# Patient Record
Sex: Female | Born: 1935 | Race: Black or African American | Hispanic: No | Smoking: Never smoker
Health system: Southern US, Community
[De-identification: ages and names within clinical notes are randomized; demographics above are authoritative.]

## PROBLEM LIST (undated history)

## (undated) DIAGNOSIS — G309 Alzheimer's disease, unspecified: Secondary | ICD-10-CM

## (undated) DIAGNOSIS — F419 Anxiety disorder, unspecified: Secondary | ICD-10-CM

## (undated) DIAGNOSIS — D649 Anemia, unspecified: Secondary | ICD-10-CM

## (undated) DIAGNOSIS — I351 Nonrheumatic aortic (valve) insufficiency: Secondary | ICD-10-CM

## (undated) DIAGNOSIS — R6889 Other general symptoms and signs: Secondary | ICD-10-CM

## (undated) DIAGNOSIS — I272 Pulmonary hypertension, unspecified: Secondary | ICD-10-CM

## (undated) DIAGNOSIS — E559 Vitamin D deficiency, unspecified: Secondary | ICD-10-CM

## (undated) DIAGNOSIS — I5042 Chronic combined systolic (congestive) and diastolic (congestive) heart failure: Secondary | ICD-10-CM

## (undated) DIAGNOSIS — F329 Major depressive disorder, single episode, unspecified: Secondary | ICD-10-CM

## (undated) DIAGNOSIS — I2510 Atherosclerotic heart disease of native coronary artery without angina pectoris: Secondary | ICD-10-CM

## (undated) DIAGNOSIS — K219 Gastro-esophageal reflux disease without esophagitis: Secondary | ICD-10-CM

## (undated) DIAGNOSIS — J302 Other seasonal allergic rhinitis: Secondary | ICD-10-CM

## (undated) DIAGNOSIS — N183 Chronic kidney disease, stage 3 unspecified: Secondary | ICD-10-CM

## (undated) DIAGNOSIS — E785 Hyperlipidemia, unspecified: Secondary | ICD-10-CM

## (undated) DIAGNOSIS — Z66 Do not resuscitate: Secondary | ICD-10-CM

## (undated) DIAGNOSIS — R32 Unspecified urinary incontinence: Secondary | ICD-10-CM

## (undated) DIAGNOSIS — I252 Old myocardial infarction: Secondary | ICD-10-CM

## (undated) DIAGNOSIS — F0280 Dementia in other diseases classified elsewhere without behavioral disturbance: Secondary | ICD-10-CM

## (undated) DIAGNOSIS — M1990 Unspecified osteoarthritis, unspecified site: Secondary | ICD-10-CM

## (undated) DIAGNOSIS — G8929 Other chronic pain: Secondary | ICD-10-CM

## (undated) DIAGNOSIS — R251 Tremor, unspecified: Secondary | ICD-10-CM

## (undated) DIAGNOSIS — I10 Essential (primary) hypertension: Secondary | ICD-10-CM

## (undated) DIAGNOSIS — F32A Depression, unspecified: Secondary | ICD-10-CM

## (undated) DIAGNOSIS — K068 Other specified disorders of gingiva and edentulous alveolar ridge: Secondary | ICD-10-CM

## (undated) DIAGNOSIS — M549 Dorsalgia, unspecified: Secondary | ICD-10-CM

## (undated) DIAGNOSIS — A048 Other specified bacterial intestinal infections: Secondary | ICD-10-CM

## (undated) DIAGNOSIS — J309 Allergic rhinitis, unspecified: Secondary | ICD-10-CM

## (undated) DIAGNOSIS — N644 Mastodynia: Secondary | ICD-10-CM

## (undated) HISTORY — DX: Other specified bacterial intestinal infections: A04.8

## (undated) HISTORY — PX: BREAST LUMPECTOMY: SHX2

## (undated) HISTORY — DX: Other seasonal allergic rhinitis: J30.2

## (undated) HISTORY — DX: Allergic rhinitis, unspecified: J30.9

## (undated) HISTORY — DX: Other chronic pain: G89.29

## (undated) HISTORY — DX: Hyperlipidemia, unspecified: E78.5

## (undated) HISTORY — DX: Dementia in other diseases classified elsewhere, unspecified severity, without behavioral disturbance, psychotic disturbance, mood disturbance, and anxiety: F02.80

## (undated) HISTORY — DX: Atherosclerotic heart disease of native coronary artery without angina pectoris: I25.10

## (undated) HISTORY — DX: Chronic kidney disease, stage 3 (moderate): N18.3

## (undated) HISTORY — DX: Other specified disorders of gingiva and edentulous alveolar ridge: K06.8

## (undated) HISTORY — DX: Unspecified urinary incontinence: R32

## (undated) HISTORY — DX: Chronic combined systolic (congestive) and diastolic (congestive) heart failure: I50.42

## (undated) HISTORY — DX: Gastro-esophageal reflux disease without esophagitis: K21.9

## (undated) HISTORY — DX: Essential (primary) hypertension: I10

## (undated) HISTORY — DX: Nonrheumatic aortic (valve) insufficiency: I35.1

## (undated) HISTORY — DX: Anemia, unspecified: D64.9

## (undated) HISTORY — DX: Dorsalgia, unspecified: M54.9

## (undated) HISTORY — DX: Mastodynia: N64.4

## (undated) HISTORY — DX: Unspecified osteoarthritis, unspecified site: M19.90

## (undated) HISTORY — DX: Anxiety disorder, unspecified: F41.9

## (undated) HISTORY — PX: CHOLECYSTECTOMY: SHX55

## (undated) HISTORY — DX: Other general symptoms and signs: R68.89

## (undated) HISTORY — DX: Depression, unspecified: F32.A

## (undated) HISTORY — DX: Chronic kidney disease, stage 3 unspecified: N18.30

## (undated) HISTORY — DX: Tremor, unspecified: R25.1

## (undated) HISTORY — DX: Major depressive disorder, single episode, unspecified: F32.9

## (undated) HISTORY — DX: Do not resuscitate: Z66

## (undated) HISTORY — DX: Vitamin D deficiency, unspecified: E55.9

## (undated) HISTORY — DX: Pulmonary hypertension, unspecified: I27.20

## (undated) HISTORY — DX: Alzheimer's disease, unspecified: G30.9

---

## 2015-06-23 ENCOUNTER — Encounter (HOSPITAL_COMMUNITY): Payer: Self-pay | Admitting: Emergency Medicine

## 2015-06-23 ENCOUNTER — Emergency Department (HOSPITAL_COMMUNITY): Payer: Medicare FFS

## 2015-06-23 ENCOUNTER — Emergency Department (HOSPITAL_COMMUNITY)
Admission: EM | Admit: 2015-06-23 | Discharge: 2015-06-23 | Disposition: A | Discharge status: Home / Self Care | Payer: Medicare FFS | Attending: Emergency Medicine | Admitting: Emergency Medicine

## 2015-06-23 DIAGNOSIS — S29002A Unspecified injury of muscle and tendon of back wall of thorax, initial encounter: Secondary | ICD-10-CM | POA: Insufficient documentation

## 2015-06-23 DIAGNOSIS — Y998 Other external cause status: Secondary | ICD-10-CM | POA: Diagnosis not present

## 2015-06-23 DIAGNOSIS — S199XXA Unspecified injury of neck, initial encounter: Secondary | ICD-10-CM | POA: Insufficient documentation

## 2015-06-23 DIAGNOSIS — I252 Old myocardial infarction: Secondary | ICD-10-CM | POA: Diagnosis not present

## 2015-06-23 DIAGNOSIS — S29001A Unspecified injury of muscle and tendon of front wall of thorax, initial encounter: Secondary | ICD-10-CM | POA: Diagnosis not present

## 2015-06-23 DIAGNOSIS — Y92410 Unspecified street and highway as the place of occurrence of the external cause: Secondary | ICD-10-CM | POA: Diagnosis not present

## 2015-06-23 DIAGNOSIS — M542 Cervicalgia: Secondary | ICD-10-CM

## 2015-06-23 DIAGNOSIS — Z8719 Personal history of other diseases of the digestive system: Secondary | ICD-10-CM | POA: Insufficient documentation

## 2015-06-23 DIAGNOSIS — F0390 Unspecified dementia without behavioral disturbance: Secondary | ICD-10-CM | POA: Diagnosis not present

## 2015-06-23 DIAGNOSIS — M546 Pain in thoracic spine: Secondary | ICD-10-CM

## 2015-06-23 DIAGNOSIS — R079 Chest pain, unspecified: Secondary | ICD-10-CM

## 2015-06-23 DIAGNOSIS — Y9389 Activity, other specified: Secondary | ICD-10-CM | POA: Diagnosis not present

## 2015-06-23 HISTORY — DX: Old myocardial infarction: I25.2

## 2015-06-23 HISTORY — DX: Gastro-esophageal reflux disease without esophagitis: K21.9

## 2015-06-23 LAB — CBC
HCT: 35.4 % — ABNORMAL LOW (ref 36.0–46.0)
Hemoglobin: 11.7 g/dL — ABNORMAL LOW (ref 12.0–15.0)
MCH: 27.1 pg (ref 26.0–34.0)
MCHC: 33.1 g/dL (ref 30.0–36.0)
MCV: 82.1 fL (ref 78.0–100.0)
PLATELETS: 149 10*3/uL — AB (ref 150–400)
RBC: 4.31 MIL/uL (ref 3.87–5.11)
RDW: 14.6 % (ref 11.5–15.5)
WBC: 4.6 10*3/uL (ref 4.0–10.5)

## 2015-06-23 LAB — BASIC METABOLIC PANEL
Anion gap: 8 (ref 5–15)
BUN: 26 mg/dL — AB (ref 6–20)
CO2: 24 mmol/L (ref 22–32)
CREATININE: 1.4 mg/dL — AB (ref 0.44–1.00)
Calcium: 9.2 mg/dL (ref 8.9–10.3)
Chloride: 107 mmol/L (ref 101–111)
GFR calc Af Amer: 38 mL/min — ABNORMAL LOW (ref >60)
GFR, EST NON AFRICAN AMERICAN: 32 mL/min — AB (ref >60)
Glucose, Bld: 97 mg/dL (ref 65–99)
Potassium: 3.7 mmol/L (ref 3.5–5.1)
SODIUM: 139 mmol/L (ref 135–145)

## 2015-06-23 LAB — TROPONIN I: TROPONIN I: 0.03 ng/mL (ref <0.031)

## 2015-06-23 MED ORDER — ONDANSETRON 4 MG PO TBDP
4.0000 mg | ORAL_TABLET | Freq: Once | ORAL | Status: AC
  Administered 2015-06-23: 4 mg via ORAL
  Filled 2015-06-23: qty 1

## 2015-06-23 MED ORDER — TRAMADOL HCL 50 MG PO TABS: 50.0000 mg | ORAL_TABLET | Freq: Four times a day (QID) | ORAL | Status: DC | PRN

## 2015-06-23 MED ORDER — HYDROCODONE-ACETAMINOPHEN 5-325 MG PO TABS
1.0000 | ORAL_TABLET | Freq: Once | ORAL | Status: AC
  Administered 2015-06-23: 1 via ORAL
  Filled 2015-06-23: qty 1
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
## 2015-06-23 NOTE — ED Notes (Signed)
EKG completed given to EDP.  

## 2015-10-31 LAB — HEPATIC FUNCTION PANEL
ALT: 15 U/L (ref 7–35)
AST: 20 U/L (ref 13–35)

## 2015-10-31 LAB — LIPID PANEL
Cholesterol: 129 mg/dL (ref 0–200)
HDL: 45 mg/dL (ref 35–70)
LDL Cholesterol: 57 mg/dL
Triglycerides: 137 mg/dL (ref 40–160)

## 2015-10-31 LAB — BASIC METABOLIC PANEL
Creatinine: 1.2 mg/dL — AB (ref <=1.1)
Glucose: 73 mg/dL
POTASSIUM: 4.2 mmol/L (ref 3.4–5.3)
SODIUM: 134 mmol/L — AB (ref 137–147)

## 2015-10-31 LAB — ESTIMATED GFR: GFR, EST AFRICAN AMERICAN: 50

## 2016-02-19 ENCOUNTER — Ambulatory Visit (INDEPENDENT_AMBULATORY_CARE_PROVIDER_SITE_OTHER): Payer: Medicare HMO | Admitting: Osteopathic Medicine

## 2016-02-19 ENCOUNTER — Encounter: Payer: Self-pay | Admitting: Osteopathic Medicine

## 2016-02-19 VITALS — BP 124/62 | HR 75 | Ht 65.0 in | Wt 160.0 lb

## 2016-02-19 DIAGNOSIS — K0510 Chronic gingivitis, plaque induced: Secondary | ICD-10-CM

## 2016-02-19 DIAGNOSIS — Z8679 Personal history of other diseases of the circulatory system: Secondary | ICD-10-CM

## 2016-02-19 DIAGNOSIS — I2510 Atherosclerotic heart disease of native coronary artery without angina pectoris: Secondary | ICD-10-CM | POA: Diagnosis not present

## 2016-02-19 DIAGNOSIS — G8929 Other chronic pain: Secondary | ICD-10-CM

## 2016-02-19 DIAGNOSIS — K219 Gastro-esophageal reflux disease without esophagitis: Secondary | ICD-10-CM

## 2016-02-19 DIAGNOSIS — I252 Old myocardial infarction: Secondary | ICD-10-CM | POA: Diagnosis not present

## 2016-02-19 DIAGNOSIS — M549 Dorsalgia, unspecified: Secondary | ICD-10-CM | POA: Insufficient documentation

## 2016-02-19 DIAGNOSIS — F0390 Unspecified dementia without behavioral disturbance: Secondary | ICD-10-CM

## 2016-02-19 DIAGNOSIS — R6889 Other general symptoms and signs: Secondary | ICD-10-CM | POA: Diagnosis not present

## 2016-02-19 DIAGNOSIS — F5101 Primary insomnia: Secondary | ICD-10-CM

## 2016-02-19 MED ORDER — CHLORHEXIDINE GLUCONATE 0.12% ORAL RINSE (MEDLINE KIT): 15.0000 mL | Freq: Two times a day (BID) | OROMUCOSAL | 1 refills | Status: DC

## 2016-02-19 MED ORDER — TRAZODONE HCL 100 MG PO TABS: 100.0000 mg | ORAL_TABLET | Freq: Every day | ORAL | 3 refills | Status: DC

## 2016-02-19 MED ORDER — SPIRONOLACTONE 25 MG PO TABS: 25.0000 mg | ORAL_TABLET | Freq: Every day | ORAL | 1 refills | Status: DC

## 2016-02-19 MED ORDER — LISINOPRIL 2.5 MG PO TABS: 2.5000 mg | ORAL_TABLET | Freq: Every day | ORAL | 1 refills | Status: DC

## 2016-02-19 MED ORDER — ATORVASTATIN CALCIUM 20 MG PO TABS: 20.0000 mg | ORAL_TABLET | Freq: Every day | ORAL | 3 refills | Status: DC

## 2016-02-19 MED ORDER — ISOSORBIDE MONONITRATE ER 30 MG PO TB24: 30.0000 mg | ORAL_TABLET | Freq: Every day | ORAL | 1 refills | Status: DC

## 2016-02-19 MED ORDER — DONEPEZIL HCL 10 MG PO TABS: 10.0000 mg | ORAL_TABLET | Freq: Every day | ORAL | 1 refills | Status: DC

## 2016-02-19 MED ORDER — DICLOFENAC SODIUM 1 % TD GEL: 4.0000 g | Freq: Four times a day (QID) | TRANSDERMAL | 3 refills | Status: DC

## 2016-02-19 MED ORDER — FUROSEMIDE 40 MG PO TABS: 40.0000 mg | ORAL_TABLET | Freq: Every day | ORAL | 1 refills | Status: DC

## 2016-02-19 NOTE — Progress Notes (Signed)
HPI: Madeline Atkinson is a 83 y.o. female  who presents to [facility] today, 02/19/16,  for chief complaint of:  Chief Complaint  Patient presents with  . Establish Care    Very pleasant new patient here to establish care, accompanied by daughter. Daughter has detailed records with her, gives much of the history. Patient and daughter have no complaints today except for sore gums, they are in the process of finding a dentist. Transferring care d/t recent move to the area.   CARDIOVASCULAR MI 2017, no PCI d/t age Chronic Systolic and Diastolic CHF per daughter  On Imdur, Spironolactone, Carvedilol, Furosemide, Atorvastatin 20 mg, Lisinopril.  Was released from heart clinic, needs new cardiologist   RESPIRATORY Never smoker, secondhand smoke exposure   NEUROLOGICAL Previously on "nerve pill" for depression after husband's death Dementia - is on Donepezil  Insomnia - controlled on Trazodone   URINARY Incontinence - uses Depends. No UTI.   GASTROINTESTINAL ocasoinally needs stool softener GERD - on Omeprazole, other medications not able to control symptoms  MUSCULOSKELETAL/RHEUM Back pain - on Naproxen 220 bid  Had also been on other medication which caused nausea (think was muscle relaxer), nervous to try Tylenol d/t liver labs abn? No records available at this time  HEENT Seasonal allergies, controlled with OTC medication  Gum pain recently, no difficulty chewing    Past medical, surgical, social and family history reviewed: Patient Active Problem List   Diagnosis Date Noted  . History of MI (myocardial infarction) 02/19/2016  . Coronary artery disease involving native coronary artery of native heart without angina pectoris 02/19/2016  . History of CHF (congestive heart failure) 02/19/2016  . Dementia 02/19/2016  . GERD (gastroesophageal reflux disease) 02/19/2016  . Back pain 02/19/2016  . Cold intolerance 02/19/2016   Past Surgical  History:  Procedure Laterality Date  . BREAST LUMPECTOMY    . CHOLECYSTECTOMY     Social History  Substance Use Topics  . Smoking status: Never Smoker  . Smokeless tobacco: Never Used  . Alcohol use No   History reviewed. No pertinent family history.   Current medication list and allergy/intolerance information reviewed:   Current Outpatient Prescriptions  Medication Sig Dispense Refill  . carvedilol (COREG) 3.125 MG tablet     . omeprazole (PRILOSEC) 40 MG capsule      No current facility-administered medications for this visit.    Allergies  Allergen Reactions  . Codeine       Review of Systems:  Constitutional:  No  fever, no chills, No recent illness, No unintentional weight changes.  HEENT: No  headache, no vision change, no hearing change, No sore throat, No  sinus pressure  Cardiac: No  chest pain, No  pressure, No palpitations, No  Orthopnea  Respiratory:  No  shortness of breath. No  Cough  Gastrointestinal: No  abdominal pain, No  nausea, No  vomiting,  No  blood in stool, No  diarrhea, No  constipation   Musculoskeletal: No new myalgia/arthralgia  Genitourinary: No  incontinence, No  abnormal genital bleeding, No abnormal genital discharge  Skin: No  Rash, No other wounds/concerning lesions  Hem/Onc: No  easy bruising/bleeding, No  abnormal lymph node  Endocrine: No cold intolerance,  No heat intolerance. No polyuria/polydipsia/polyphagia   Neurologic: No  weakness, No  dizziness, No  slurred speech/focal weakness/facial droop, +memory problems  Psychiatric: No  concerns with depression, No  concerns with anxiety, +sleep problems, No mood problems  Exam:  BP  124/62   Pulse 75   Ht 5' 5" (1.651 m)   Wt 160 lb (72.6 kg)   BMI 26.63 kg/m   Constitutional: VS see above. General Appearance: alert, well-developed, well-nourished, NAD  Eyes: Normal lids and conjunctive, non-icteric sclera  Ears, Nose, Mouth, Throat: MMM, Normal external inspection  ears/nares/mouth/lips/gums. TM normal bilaterally. Pharynx/tonsils no erythema, no exudate. Nasal mucosa normal.   Neck: No masses, trachea midline. No thyroid enlargement. No tenderness/mass appreciated. No lymphadenopathy  Respiratory: Normal respiratory effort. no wheeze, no rhonchi, no rales  Cardiovascular: S1/S2 normal, no murmur, no rub/gallop auscultated. RRR. No lower extremity edema.   Gastrointestinal: Nontender, no masses. No hepatomegaly, no splenomegaly. No hernia appreciated. Bowel sounds normal. Rectal exam deferred.   Musculoskeletal: Gait normal. No clubbing/cyanosis of digits.   Neurological: Normal balance/coordination. No tremor.   Skin: warm, dry, intact.   Psychiatric: Normal judgment/insight. Normal mood and affect. Oriented x3.     ASSESSMENT/PLAN:    Coronary artery disease involving native coronary artery of native heart without angina pectoris - Plan: carvedilol (COREG) 3.125 MG tablet, aspirin 81 MG tablet, Ambulatory referral to Cardiology, CBC with Differential/Platelet, COMPLETE METABOLIC PANEL WITH GFR, VITAMIN D 25 Hydroxy (Vit-D Deficiency, Fractures), TSH, Lipid panel, atorvastatin (LIPITOR) 20 MG tablet, isosorbide mononitrate (IMDUR) 30 MG 24 hr tablet, lisinopril (PRINIVIL,ZESTRIL) 2.5 MG tablet  History of MI (myocardial infarction)  Cold intolerance - Plan: TSH  History of CHF (congestive heart failure) - Plan: carvedilol (COREG) 3.125 MG tablet, aspirin 81 MG tablet, atorvastatin (LIPITOR) 20 MG tablet, spironolactone (ALDACTONE) 25 MG tablet, isosorbide mononitrate (IMDUR) 30 MG 24 hr tablet, furosemide (LASIX) 40 MG tablet, lisinopril (PRINIVIL,ZESTRIL) 2.5 MG tablet  Dementia without behavioral disturbance, unspecified dementia type - Plan: donepezil (ARICEPT) 10 MG tablet  Gastroesophageal reflux disease, esophagitis presence not specified - Plan: omeprazole (PRILOSEC) 40 MG capsule  Chronic bilateral back pain, unspecified back  location - Plan: CVS NAPROXEN SODIUM PO, diclofenac sodium (VOLTAREN) 1 % GEL  Primary insomnia - Plan: traZODone (DESYREL) 100 MG tablet  Gingivitis - Plan: chlorhexidine gluconate, MEDLINE KIT, (PERIDEX) 0.12 % solution    Visit summary with medication list and pertinent instructions was printed for patient to review. All questions at time of visit were answered - patient instructed to contact office with any additional concerns. ER/RTC precautions were reviewed with the patient. Follow-up plan: Return in about 4 months (around 06/18/2016) for followup on routine chronic issues . Pending lab results  Note: Total time spent 45 minutes, greater than 50% of the visit was spent face-to-face counseling and coordinating care for the following: The primary encounter diagnosis was Coronary artery disease involving native coronary artery of native heart without angina pectoris. Diagnoses of History of MI (myocardial infarction), Cold intolerance, History of CHF (congestive heart failure), Dementia without behavioral disturbance, unspecified dementia type, Gastroesophageal reflux disease, esophagitis presence not specified, Chronic bilateral back pain, unspecified back location, Primary insomnia, and Gingivitis were also pertinent to this visit.Marland Kitchen

## 2016-03-05 ENCOUNTER — Encounter: Payer: Self-pay | Admitting: *Deleted

## 2016-03-10 ENCOUNTER — Other Ambulatory Visit: Payer: Self-pay

## 2016-03-10 DIAGNOSIS — M549 Dorsalgia, unspecified: Principal | ICD-10-CM

## 2016-03-10 DIAGNOSIS — G8929 Other chronic pain: Secondary | ICD-10-CM

## 2016-03-10 MED ORDER — DICLOFENAC SODIUM 1 % TD GEL: 4.0000 g | Freq: Four times a day (QID) | TRANSDERMAL | 3 refills | Status: DC

## 2016-03-10 NOTE — Telephone Encounter (Signed)
REFILL REQUEST FOR DICLOFENAC 1%

## 2016-03-16 ENCOUNTER — Ambulatory Visit (INDEPENDENT_AMBULATORY_CARE_PROVIDER_SITE_OTHER): Payer: Medicare HMO | Admitting: Cardiology

## 2016-03-16 ENCOUNTER — Encounter: Payer: Self-pay | Admitting: Cardiology

## 2016-03-16 ENCOUNTER — Encounter (INDEPENDENT_AMBULATORY_CARE_PROVIDER_SITE_OTHER): Payer: Self-pay

## 2016-03-16 VITALS — BP 130/60 | HR 76 | Ht 65.0 in | Wt 162.1 lb

## 2016-03-16 DIAGNOSIS — I5042 Chronic combined systolic (congestive) and diastolic (congestive) heart failure: Secondary | ICD-10-CM | POA: Diagnosis not present

## 2016-03-16 DIAGNOSIS — E785 Hyperlipidemia, unspecified: Secondary | ICD-10-CM

## 2016-03-16 DIAGNOSIS — I2510 Atherosclerotic heart disease of native coronary artery without angina pectoris: Secondary | ICD-10-CM

## 2016-03-16 NOTE — Patient Instructions (Signed)

## 2016-03-16 NOTE — Progress Notes (Signed)
Cardiology Office Note    Date:  03/17/2016   ID:  Madeline Atkinson, DOB 1924/10/19, MRN [id number]  PCP:  Emeterio Reeve, DO  Cardiologist:  Fransico Him, MD   Chief Complaint  Patient presents with  . Coronary Artery Disease  . Hypertension  . Congestive Heart Failure  . Cardiomyopathy    History of Present Illness:  Madeline Atkinson is a 83 y.o. female with a history of GERD, Dementia, chronic combined systolic/diastolic CHF and MI years ago and then in October of 2016 with no cath due to advanced age who is referred to establish new cardiac care.  She recently moved to the area and is with her daughter. She is on BB, ASA, statin, Imdur, ACE I and Lasix.  She is doing well from a cardiac standpoint. Her anginal equivalent is upper abdominal pain which she has not had since the MI.  She has had a few hospitalizations for acute CHF exacerbations, the last being a year ago.   She denies any chest of abdominal pain, SOB, DOE, LE edema, dizziness, palpitations, PND, orthopnea or syncope.     Past Medical History:  Diagnosis Date  . Acid reflux   . Back pain   . CAD (coronary artery disease), native coronary artery    without angina pectoris  . Chronic combined systolic and diastolic heart failure ([place]) 03/17/2016  . Cold intolerance   . Dementia   . GERD (gastroesophageal reflux disease)   . H/O CHF   . Hyperlipidemia LDL goal <70 03/17/2016  . MI, old   . Pain in gums   . Seasonal allergies   . Vitamin D deficiency     Past Surgical History:  Procedure Laterality Date  . BREAST LUMPECTOMY    . CHOLECYSTECTOMY      Current Medications: Current Meds  Medication Sig  . aspirin 81 MG tablet Take 81 mg by mouth daily.  Marland Kitchen atorvastatin (LIPITOR) 20 MG tablet Take 1 tablet (20 mg total) by mouth daily.  . carvedilol (COREG) 3.125 MG tablet   . chlorhexidine gluconate, MEDLINE KIT, (PERIDEX) 0.12 % solution Use as directed 15 mLs in the mouth or throat 2 (two) times daily.  . CVS  NAPROXEN SODIUM PO Take 220 mg by mouth every 8 (eight) hours as needed.  . diclofenac sodium (VOLTAREN) 1 % GEL Apply 4 g topically 4 (four) times daily. Apply to affected painful area  . donepezil (ARICEPT) 10 MG tablet Take 1 tablet (10 mg total) by mouth at bedtime.  . furosemide (LASIX) 40 MG tablet Take 1 tablet (40 mg total) by mouth daily.  . isosorbide mononitrate (IMDUR) 30 MG 24 hr tablet Take 1 tablet (30 mg total) by mouth daily.  Marland Kitchen lisinopril (PRINIVIL,ZESTRIL) 2.5 MG tablet Take 1 tablet (2.5 mg total) by mouth daily.  Marland Kitchen omeprazole (PRILOSEC) 40 MG capsule   . spironolactone (ALDACTONE) 25 MG tablet Take 1 tablet (25 mg total) by mouth daily.  . traZODone (DESYREL) 100 MG tablet Take 1 tablet (100 mg total) by mouth at bedtime.    Allergies:   Codeine   Social History   Social History  . Marital status: Widowed    Spouse name: N/A  . Number of children: N/A  . Years of education: N/A   Social History Main Topics  . Smoking status: Never Smoker  . Smokeless tobacco: Never Used  . Alcohol use No  . Drug use: No  . Sexual activity: No   Other Topics Concern  .  None   Social History Narrative  . None     Family History:  The patient's family history includes Heart attack in her father; Heart disease in her father and mother; Heart failure in her mother.   ROS:   Please see the history of present illness.    ROS All other systems reviewed and are negative.  No flowsheet data found.     PHYSICAL EXAM:   VS:  BP 130/60   Pulse 76   Ht 5' 5"  (1.651 m)   Wt 162 lb 1.9 oz (73.5 kg)   SpO2 99%   BMI 26.98 kg/m    GEN: Well nourished, well developed, in no acute distress  HEENT: normal  Neck: no JVD, carotid bruits, or masses Cardiac: RRR; no murmurs, rubs, or gallops,no edema.  Intact distal pulses bilaterally.  Respiratory:  clear to auscultation bilaterally, normal work of breathing GI: soft, nontender, nondistended, + BS MS: no deformity or atrophy    Skin: warm and dry, no rash Neuro:  Alert and Oriented x 3, Strength and sensation are intact Psych: euthymic mood, full affect  Wt Readings from Last 3 Encounters:  03/16/16 162 lb 1.9 oz (73.5 kg)  02/19/16 160 lb (72.6 kg)  06/23/15 160 lb 5 oz (72.7 kg)      Studies/Labs Reviewed:   EKG:  EKG is ordered today.    Recent Labs: 06/23/2015: BUN 26; Creatinine, Ser 1.40; Hemoglobin 11.7; Platelets 149; Potassium 3.7; Sodium 139   Lipid Panel No results found for: CHOL, TRIG, HDL, CHOLHDL, VLDL, LDLCALC, LDLDIRECT  Additional studies/ records that were reviewed today include:  OV notes from PCP    ASSESSMENT:    1. Coronary artery disease involving native coronary artery of native heart without angina pectoris   2. Chronic combined systolic and diastolic heart failure ([place])   3. Hyperlipidemia LDL goal <70      PLAN:  In order of problems listed above:  1,.  ASCAD s/p MI on medical management.  No cath was performed due to advanced age.  Her anginal equivalent is upper abdominal pain which she has not had.  She is doing well without any complaints. She will continue on ASA/statin/long acting nitrate and BB.    2.   Chronic combined systolic/diastolic CHF - She appears euvolemic on exam.  She weighs herself daily and weight has been stable.  She knows to call if her weight increases more than 3 lbs in a day or 5lbs in a week.  She will continue on ACE I, BB, aldactone and Lasix.  I will get a copy of her last echo from her prior Cardiologist.    3.   Hyperlipidemia with LDL goal < 70.  She will continue on statin.      Medication Adjustments/Labs and Tests Ordered: Current medicines are reviewed at length with the patient today.  Concerns regarding medicines are outlined above.  Medication changes, Labs and Tests ordered today are listed in the Patient Instructions below.  Patient Instructions  Medication Instructions:  Your physician recommends that you continue on  your current medications as directed. Please refer to the Current Medication list given to you today.   Labwork: None  Testing/Procedures: None  Follow-Up: Your physician wants you to follow-up in: 6 months with Dr. Radford Pax. You will receive a reminder letter in the mail two months in advance. If you don't receive a letter, please call our office to schedule the follow-up appointment.   Any Other Special  Instructions Will Be Listed Below (If Applicable).     If you need a refill on your cardiac medications before your next appointment, please call your pharmacy.      Signed, Fransico Him, MD  03/17/2016 12:12 PM    [address], [place], [place]  [address] Phone: [phone]; Fax: [phone]

## 2016-03-17 ENCOUNTER — Encounter: Payer: Self-pay | Admitting: Cardiology

## 2016-03-17 DIAGNOSIS — E785 Hyperlipidemia, unspecified: Secondary | ICD-10-CM

## 2016-03-17 DIAGNOSIS — I5042 Chronic combined systolic (congestive) and diastolic (congestive) heart failure: Secondary | ICD-10-CM | POA: Insufficient documentation

## 2016-03-17 HISTORY — DX: Hyperlipidemia, unspecified: E78.5

## 2016-03-24 ENCOUNTER — Encounter: Payer: Self-pay | Admitting: Cardiology

## 2016-03-24 DIAGNOSIS — G8929 Other chronic pain: Secondary | ICD-10-CM | POA: Insufficient documentation

## 2016-03-24 DIAGNOSIS — J309 Allergic rhinitis, unspecified: Secondary | ICD-10-CM | POA: Insufficient documentation

## 2016-03-24 DIAGNOSIS — M549 Dorsalgia, unspecified: Secondary | ICD-10-CM

## 2016-03-24 DIAGNOSIS — G309 Alzheimer's disease, unspecified: Secondary | ICD-10-CM

## 2016-03-24 DIAGNOSIS — N644 Mastodynia: Secondary | ICD-10-CM | POA: Insufficient documentation

## 2016-03-24 DIAGNOSIS — R32 Unspecified urinary incontinence: Secondary | ICD-10-CM | POA: Insufficient documentation

## 2016-03-24 DIAGNOSIS — F32A Depression, unspecified: Secondary | ICD-10-CM | POA: Insufficient documentation

## 2016-03-24 DIAGNOSIS — F0280 Dementia in other diseases classified elsewhere without behavioral disturbance: Secondary | ICD-10-CM | POA: Insufficient documentation

## 2016-03-24 DIAGNOSIS — A048 Other specified bacterial intestinal infections: Secondary | ICD-10-CM | POA: Insufficient documentation

## 2016-03-24 DIAGNOSIS — I351 Nonrheumatic aortic (valve) insufficiency: Secondary | ICD-10-CM | POA: Insufficient documentation

## 2016-03-24 DIAGNOSIS — Z66 Do not resuscitate: Secondary | ICD-10-CM | POA: Insufficient documentation

## 2016-03-24 DIAGNOSIS — M1990 Unspecified osteoarthritis, unspecified site: Secondary | ICD-10-CM | POA: Insufficient documentation

## 2016-03-24 DIAGNOSIS — F329 Major depressive disorder, single episode, unspecified: Secondary | ICD-10-CM | POA: Insufficient documentation

## 2016-03-24 DIAGNOSIS — R251 Tremor, unspecified: Secondary | ICD-10-CM | POA: Insufficient documentation

## 2016-03-24 DIAGNOSIS — F419 Anxiety disorder, unspecified: Secondary | ICD-10-CM | POA: Insufficient documentation

## 2016-03-24 DIAGNOSIS — I10 Essential (primary) hypertension: Secondary | ICD-10-CM | POA: Insufficient documentation

## 2016-03-24 DIAGNOSIS — I272 Pulmonary hypertension, unspecified: Secondary | ICD-10-CM | POA: Insufficient documentation

## 2016-03-26 ENCOUNTER — Encounter: Payer: Self-pay | Admitting: Osteopathic Medicine

## 2016-04-19 ENCOUNTER — Ambulatory Visit (INDEPENDENT_AMBULATORY_CARE_PROVIDER_SITE_OTHER): Payer: Medicare HMO | Admitting: Osteopathic Medicine

## 2016-04-19 VITALS — BP 121/57 | HR 70 | Temp 98.5°F | Wt 160.0 lb

## 2016-04-19 DIAGNOSIS — J069 Acute upper respiratory infection, unspecified: Secondary | ICD-10-CM | POA: Diagnosis not present

## 2016-04-19 DIAGNOSIS — B9789 Other viral agents as the cause of diseases classified elsewhere: Secondary | ICD-10-CM

## 2016-04-19 MED ORDER — IPRATROPIUM BROMIDE 0.03 % NA SOLN: 1.0000 | Freq: Four times a day (QID) | NASAL | 0 refills | Status: DC

## 2016-04-19 NOTE — Patient Instructions (Signed)
Note: the following list assumes no pregnancy, normal liver & kidney function and no other drug interactions. Dr. Sheppard Coil has highlighted medications which are safe for you to use, but these may not be appropriate for everyone. Always ask a pharmacist or qualified medical provider if there are any questions!    Aches/Pains, Fever Acetaminophen (Tylenol) 500 mg tablets - take max 2 tablets (1000 mg) every 6 hours (4 times per day)   Sinus Congestion Prescription Atrovent Nasal Saline if desired Phenylephrine (Sudafed) 10 mg tablets every 4 hours (or the 12-hour formulation) Diphenhydramine (Benadryl) 25 mg tablets - take 1-2 tablets every 6 hours  Cough & Sore Throat Prescription cough pills or syrups Dextromethorphan (Robitussin, others) - cough suppressant Guaifenesin (Robitussin, Mucinex, others) - expectorant (helps cough up mucus) (Dextromethorphan and Guaifenesin also come in a combination tablet) Lozenges w/ Benzocaine + Menthol (Cepacol) Honey - as much as you want! Teas which "coat the throat" - look for ingredients Elm Bark, Licorice Root, Marshmallow Root  Other Zinc Lozenges within 24 hours of symptoms onset - mixed evidence this shortens the duration of the common cold Don't waste your money on Vitamin C or Echinacea

## 2016-04-19 NOTE — Progress Notes (Signed)
HPI: Madeline Atkinson is a 83 y.o. female who presents to [facility] 04/19/16 for chief complaint of:  Chief Complaint  Patient presents with  . Cough    Acute Illness: Cough/cold symptoms for the past 2-3 days. Daughter was concerned given patient's heart history. No fever or shortness of breath. Occasional mucus production. Sinus congestion.   Past medical, social and family history reviewed.  Patient Active Problem List   Diagnosis Date Noted  . Allergic rhinitis   . Anxiety   . Chronic back pain   . Alzheimer disease   . Depression   . Tremor   . Benign essential HTN   . Mastodynia   . OA (osteoarthritis)   . Urinary incontinence   . H. pylori infection   . Pulmonary hypertension   . Aortic insufficiency   . DNR (do not resuscitate)   . Chronic combined systolic and diastolic heart failure ([place]) 03/17/2016  . Hyperlipidemia LDL goal <70 03/17/2016  . History of MI (myocardial infarction) 02/19/2016  . Coronary artery disease involving native coronary artery of native heart without angina pectoris 02/19/2016  . Dementia 02/19/2016  . GERD (gastroesophageal reflux disease) 02/19/2016  . Back pain 02/19/2016  . Cold intolerance 02/19/2016    Current medications and allergies reviewed.     Review of Systems:  Constitutional: No  fever/chills  HEENT: sinus headache, No  sore throat, No  swollen glands  Cardiovascular: No chest pain  Respiratory:Yes  cough, No  shortness of breath  Gastrointestinal: No  nausea, No  vomiting,  No  diarrhea  Musculoskeletal:   No  myalgia/arthralgia  Skin/Integument:  No  rash   Detailed Exam:  BP (!) 121/57   Pulse 70   Temp 98.5 F (36.9 C) (Oral)   Wt 160 lb (72.6 kg)   BMI 26.63 kg/m   Constitutional:   VSS, see above.   General Appearance: alert, well-developed, well-nourished, NAD  Eyes:   Normal lids and conjunctive, non-icteric sclera  Ears, Nose, Mouth, Throat:   Normal  external inspection ears/nares  Normal mouth/lips/gums, MMM  normal TM  posterior pharynx without erythema, without exudate  nasal mucosa normal  Skin:  Normal inspection, no rash or concerning lesions noted on limited exam  Neck:   No masses, trachea midline. normal lymph nodes  Respiratory:   Normal respiratory effort.   No  wheeze/rhonchi/rales  Cardiovascular:   S1/S2 normal, no murmur/rub/gallop auscultated. RRR.     ASSESSMENT/PLAN:  Viral upper respiratory illness - Plan: ipratropium (ATROVENT) 0.03 % nasal spray  Patient's daughter requested note for adult daycare. Advised that patient certainly could be contagious, standard contact precautions were reviewed but I don't think any extensive isolation measures need to be taken. Conservative supportive care for now, precautions were reviewed with regard to worsening or changing symptoms. No fever or shortness of breath and normal lung exam reassuring. Most likely viral illness   Patient Instructions  Note: the following list assumes no pregnancy, normal liver & kidney function and no other drug interactions. Dr. Sheppard Coil has highlighted medications which are safe for you to use, but these may not be appropriate for everyone. Always ask a pharmacist or qualified medical provider if there are any questions!    Aches/Pains, Fever Acetaminophen (Tylenol) 500 mg tablets - take max 2 tablets (1000 mg) every 6 hours (4 times per day)   Sinus Congestion Prescription Atrovent Nasal Saline if desired Phenylephrine (Sudafed) 10 mg tablets every 4 hours (or the  12-hour formulation) Diphenhydramine (Benadryl) 25 mg tablets - take 1-2 tablets every 6 hours  Cough & Sore Throat Prescription cough pills or syrups Dextromethorphan (Robitussin, others) - cough suppressant Guaifenesin (Robitussin, Mucinex, others) - expectorant (helps cough up mucus) (Dextromethorphan and Guaifenesin also come in a combination  tablet) Lozenges w/ Benzocaine + Menthol (Cepacol) Honey - as much as you want! Teas which "coat the throat" - look for ingredients Elm Bark, Licorice Root, Marshmallow Root  Other Zinc Lozenges within 24 hours of symptoms onset - mixed evidence this shortens the duration of the common cold Don't waste your money on Vitamin C or Echinacea     Visit summary was printed for the patient with medications and pertinent instructions for patient to review. ER/RTC precautions reviewed. All questions answered. Return if symptoms worsen or fail to improve, or if fever / shortness of breath develop .

## 2016-04-30 ENCOUNTER — Telehealth: Payer: Self-pay | Admitting: *Deleted

## 2016-04-30 DIAGNOSIS — Z8679 Personal history of other diseases of the circulatory system: Secondary | ICD-10-CM

## 2016-04-30 DIAGNOSIS — I2510 Atherosclerotic heart disease of native coronary artery without angina pectoris: Secondary | ICD-10-CM

## 2016-04-30 DIAGNOSIS — K219 Gastro-esophageal reflux disease without esophagitis: Secondary | ICD-10-CM

## 2016-04-30 MED ORDER — CARVEDILOL 3.125 MG PO TABS: 3.1250 mg | ORAL_TABLET | Freq: Two times a day (BID) | ORAL | 3 refills | Status: DC

## 2016-04-30 MED ORDER — OMEPRAZOLE 40 MG PO CPDR: 40.0000 mg | DELAYED_RELEASE_CAPSULE | Freq: Every day | ORAL | 3 refills | Status: DC

## 2016-04-30 NOTE — Telephone Encounter (Signed)
Sent!

## 2016-04-30 NOTE — Telephone Encounter (Signed)
Received refill request for carvedilol and omeprazole. Since these are historical medications a provider has to send them initially

## 2016-05-10 ENCOUNTER — Other Ambulatory Visit: Payer: Self-pay

## 2016-05-10 NOTE — Telephone Encounter (Signed)
Refill request for Rosuvastatin. 20 mg. Patient is not on this medication. Rhonda Cunningham,CMA

## 2016-05-13 ENCOUNTER — Other Ambulatory Visit: Payer: Self-pay

## 2016-05-13 DIAGNOSIS — Z8679 Personal history of other diseases of the circulatory system: Secondary | ICD-10-CM

## 2016-05-13 DIAGNOSIS — I2510 Atherosclerotic heart disease of native coronary artery without angina pectoris: Secondary | ICD-10-CM

## 2016-05-13 MED ORDER — ATORVASTATIN CALCIUM 20 MG PO TABS: 20.0000 mg | ORAL_TABLET | Freq: Every day | ORAL | 3 refills | Status: DC

## 2016-05-13 NOTE — Telephone Encounter (Signed)
REFILL REQUEST FOR ATORVASTATIN 20 MG. #[id number] REFILLS SENT TO [facility]. Rhonda Cunningham,CMA

## 2016-05-17 ENCOUNTER — Other Ambulatory Visit: Payer: Self-pay

## 2016-05-17 DIAGNOSIS — J069 Acute upper respiratory infection, unspecified: Secondary | ICD-10-CM

## 2016-05-17 MED ORDER — IPRATROPIUM BROMIDE 0.03 % NA SOLN: 1.0000 | Freq: Four times a day (QID) | NASAL | 0 refills | Status: DC

## 2016-06-18 ENCOUNTER — Ambulatory Visit (INDEPENDENT_AMBULATORY_CARE_PROVIDER_SITE_OTHER): Payer: Medicare HMO | Admitting: Osteopathic Medicine

## 2016-06-18 VITALS — BP 117/73 | HR 90 | Wt 159.0 lb

## 2016-06-18 DIAGNOSIS — F0390 Unspecified dementia without behavioral disturbance: Secondary | ICD-10-CM

## 2016-06-18 DIAGNOSIS — B354 Tinea corporis: Secondary | ICD-10-CM

## 2016-06-18 DIAGNOSIS — M1990 Unspecified osteoarthritis, unspecified site: Secondary | ICD-10-CM

## 2016-06-18 DIAGNOSIS — I2510 Atherosclerotic heart disease of native coronary artery without angina pectoris: Secondary | ICD-10-CM | POA: Diagnosis not present

## 2016-06-18 MED ORDER — CLOTRIMAZOLE 1 % EX CREA: 1.0000 "application " | TOPICAL_CREAM | Freq: Two times a day (BID) | CUTANEOUS | 1 refills | Status: DC

## 2016-06-18 NOTE — Progress Notes (Signed)
HPI: Madeline Atkinson is a 83 y.o. female  who presents to [facility] today, 06/18/16,  for chief complaint of:  Chief Complaint  Patient presents with  . Follow-up    Needs paperwork filled out for benefits from [place] and clearance to participate in adult daycare activities.  Skin: Reddish, scaly patches and a few spots on the legs, not itchy, no over-the-counter medications or home remedies tried, no drainage. No joint pain/fever. No history of psoriasis or other skin disorder.  Past medical history, surgical history, social history and family history reviewed.  Patient Active Problem List   Diagnosis Date Noted  . Allergic rhinitis   . Anxiety   . Chronic back pain   . Alzheimer disease   . Depression   . Tremor   . Benign essential HTN   . Mastodynia   . OA (osteoarthritis)   . Urinary incontinence   . H. pylori infection   . Pulmonary hypertension ([place])   . Aortic insufficiency   . DNR (do not resuscitate)   . Chronic combined systolic and diastolic heart failure ([place]) 03/17/2016  . Hyperlipidemia LDL goal <70 03/17/2016  . History of MI (myocardial infarction) 02/19/2016  . Coronary artery disease involving native coronary artery of native heart without angina pectoris 02/19/2016  . Dementia 02/19/2016  . GERD (gastroesophageal reflux disease) 02/19/2016  . Back pain 02/19/2016  . Cold intolerance 02/19/2016    Current medication list and allergy/intolerance information reviewed.   Current Outpatient Prescriptions on File Prior to Visit  Medication Sig Dispense Refill  . aspirin 81 MG tablet Take 81 mg by mouth daily.    Marland Kitchen atorvastatin (LIPITOR) 20 MG tablet Take 1 tablet (20 mg total) by mouth daily. 90 tablet 3  . carvedilol (COREG) 3.125 MG tablet Take 1 tablet (3.125 mg total) by mouth 2 (two) times daily with a meal. 180 tablet 3  . chlorhexidine gluconate, MEDLINE KIT, (PERIDEX) 0.12 % solution Use as directed 15 mLs in the mouth or  throat 2 (two) times daily. 473 mL 1  . Cholecalciferol (VITAMIN D PO) Take by mouth. Vitamin D Hydroxy 25 daily    . [facility] NAPROXEN SODIUM PO Take 220 mg by mouth every 8 (eight) hours as needed.    . diclofenac sodium (VOLTAREN) 1 % GEL Apply 4 g topically 4 (four) times daily. Apply to affected painful area 100 g 3  . donepezil (ARICEPT) 10 MG tablet Take 1 tablet (10 mg total) by mouth at bedtime. 90 tablet 1  . furosemide (LASIX) 40 MG tablet Take 1 tablet (40 mg total) by mouth daily. 90 tablet 1  . ipratropium (ATROVENT) 0.03 % nasal spray Place 1-2 sprays into both nostrils 4 (four) times daily. 30 mL 0  . isosorbide mononitrate (IMDUR) 30 MG 24 hr tablet Take 1 tablet (30 mg total) by mouth daily. 90 tablet 1  . lisinopril (PRINIVIL,ZESTRIL) 2.5 MG tablet Take 1 tablet (2.5 mg total) by mouth daily. 90 tablet 1  . omeprazole (PRILOSEC) 40 MG capsule Take 1 capsule (40 mg total) by mouth daily. 90 capsule 3  . spironolactone (ALDACTONE) 25 MG tablet Take 1 tablet (25 mg total) by mouth daily. 90 tablet 1  . traZODone (DESYREL) 100 MG tablet Take 1 tablet (100 mg total) by mouth at bedtime. 90 tablet 3   No current facility-administered medications on file prior to visit.    Allergies  Allergen Reactions  . Codeine  Review of Systems:  Constitutional: No recent illness  HEENT: No  headache, no vision change  Cardiac: No  chest pain,  Respiratory:  No  shortness of breath.   Musculoskeletal: No new myalgia/arthralgia  Skin: +Rash  Psychiatric: No  concerns with depression, No  concerns with anxiety  Exam:  BP 117/73   Pulse 90   Wt 159 lb (72.1 kg)   BMI 26.46 kg/m   Constitutional: VS see above. General Appearance: alert, well-developed, well-nourished, NAD  Eyes: Normal lids and conjunctive, non-icteric sclera  Ears, Nose, Mouth, Throat: MMM, Normal external inspection ears/nares/mouth/lips/gums.  Neck: No masses, trachea midline.   Respiratory: Normal  respiratory effort. no wheeze, no rhonchi, no rales  Cardiovascular: S1/S2 normal, no murmur, no rub/gallop auscultated. RRR.   Musculoskeletal: Gait normal. Symmetric and independent movement of all extremities  Neurological: Normal balance/coordination. No tremor.  Skin: warm, dry, intact. Done to quarter-sized roundish scaly patches on leg 4  Psychiatric: Normal judgment/insight. Normal mood and affect. Oriented x3.     ASSESSMENT/PLAN:   Tinea corporis - Versus inflammatory skin condition, consider gout take psoriasis also. Trial antifungals and if no help consider change treatment/biopsy - Plan: clotrimazole (LOTRIMIN) 1 % cream  Coronary artery disease involving native coronary artery of native heart without angina pectoris - Mentioned on paperwork, restricted strenuous activities though I doubt she will be participating in too much of this  Dementia without behavioral disturbance, unspecified dementia type - Mentioned on paperwork. No significant daily assistance or supervision required  Osteoarthritis, unspecified osteoarthritis type, unspecified site - Mentioned on paperwork, walks with use of cane.      Follow-up plan: Return in about 6 months (around 12/18/2016) for routine checkup, sooner if needed.  Visit summary with medication list and pertinent instructions was printed for patient to review, alert [place] if any changes needed. All questions at time of visit were answered - patient instructed to contact office with any additional concerns. ER/RTC precautions were reviewed with the patient and understanding verbalized.   Note: Total time spent 15 minutes, greater than 50% of the visit was spent face-to-face counseling and coordinating care for the following: The primary encounter diagnosis was Tinea corporis. Diagnoses of Coronary artery disease involving native coronary artery of native heart without angina pectoris, Dementia without behavioral disturbance, unspecified  dementia type, and Osteoarthritis, unspecified osteoarthritis type, unspecified site were also pertinent to this visit.Marland Kitchen

## 2016-06-23 ENCOUNTER — Telehealth: Payer: Self-pay | Admitting: Osteopathic Medicine

## 2016-06-23 NOTE — Telephone Encounter (Signed)
Please call patient/daughter: I have completed the paperwork and that should be upright for them to pick up today.

## 2016-06-23 NOTE — Telephone Encounter (Signed)
Left message on patient vm advising that paperwork was ready for pickup. Rhonda Cunningham,CMA

## 2016-07-30 DIAGNOSIS — H2512 Age-related nuclear cataract, left eye: Secondary | ICD-10-CM | POA: Diagnosis not present

## 2016-07-30 DIAGNOSIS — H25012 Cortical age-related cataract, left eye: Secondary | ICD-10-CM | POA: Diagnosis not present

## 2016-07-30 DIAGNOSIS — H353131 Nonexudative age-related macular degeneration, bilateral, early dry stage: Secondary | ICD-10-CM | POA: Diagnosis not present

## 2016-07-30 DIAGNOSIS — H34831 Tributary (branch) retinal vein occlusion, right eye, with macular edema: Secondary | ICD-10-CM | POA: Diagnosis not present

## 2016-08-20 ENCOUNTER — Other Ambulatory Visit: Payer: Self-pay | Admitting: Osteopathic Medicine

## 2016-08-20 DIAGNOSIS — Z8679 Personal history of other diseases of the circulatory system: Secondary | ICD-10-CM

## 2016-08-24 ENCOUNTER — Other Ambulatory Visit: Payer: Self-pay | Admitting: Osteopathic Medicine

## 2016-08-24 DIAGNOSIS — I2510 Atherosclerotic heart disease of native coronary artery without angina pectoris: Secondary | ICD-10-CM

## 2016-08-24 DIAGNOSIS — Z8679 Personal history of other diseases of the circulatory system: Secondary | ICD-10-CM

## 2016-08-27 ENCOUNTER — Encounter (INDEPENDENT_AMBULATORY_CARE_PROVIDER_SITE_OTHER): Payer: Medicare HMO | Admitting: Ophthalmology

## 2016-08-27 DIAGNOSIS — H2512 Age-related nuclear cataract, left eye: Secondary | ICD-10-CM

## 2016-08-27 DIAGNOSIS — H35033 Hypertensive retinopathy, bilateral: Secondary | ICD-10-CM | POA: Diagnosis not present

## 2016-08-27 DIAGNOSIS — I10 Essential (primary) hypertension: Secondary | ICD-10-CM

## 2016-08-27 DIAGNOSIS — H34831 Tributary (branch) retinal vein occlusion, right eye, with macular edema: Secondary | ICD-10-CM | POA: Diagnosis not present

## 2016-08-27 DIAGNOSIS — H43813 Vitreous degeneration, bilateral: Secondary | ICD-10-CM

## 2016-09-08 ENCOUNTER — Telehealth: Payer: Self-pay | Admitting: Cardiology

## 2016-09-08 NOTE — Telephone Encounter (Signed)
New Message    Pt daughter states they are good, they do not need anything at this time

## 2016-09-08 NOTE — Telephone Encounter (Signed)
Left message to call back if patient is having any symptoms or if any assistance is needed prior to scheduled appointment.

## 2016-09-08 NOTE — Telephone Encounter (Signed)
Patient daughter [name]) calling, states that patient recently was told that she has fluid building up behind her eye. Patient's Opthalmologist thinks the fluid build-up may be related to patient's BP. No other symptoms reported. Patient is scheduled with Melina Copa for 09-16-16 @3 :30

## 2016-09-16 ENCOUNTER — Ambulatory Visit: Payer: Medicare HMO | Admitting: Physician Assistant

## 2016-09-24 ENCOUNTER — Encounter (INDEPENDENT_AMBULATORY_CARE_PROVIDER_SITE_OTHER): Payer: Medicare HMO | Admitting: Ophthalmology

## 2016-09-24 DIAGNOSIS — I10 Essential (primary) hypertension: Secondary | ICD-10-CM | POA: Diagnosis not present

## 2016-09-24 DIAGNOSIS — H2512 Age-related nuclear cataract, left eye: Secondary | ICD-10-CM | POA: Diagnosis not present

## 2016-09-24 DIAGNOSIS — H34831 Tributary (branch) retinal vein occlusion, right eye, with macular edema: Secondary | ICD-10-CM

## 2016-09-24 DIAGNOSIS — H35033 Hypertensive retinopathy, bilateral: Secondary | ICD-10-CM | POA: Diagnosis not present

## 2016-09-24 DIAGNOSIS — H43813 Vitreous degeneration, bilateral: Secondary | ICD-10-CM | POA: Diagnosis not present

## 2016-10-06 ENCOUNTER — Encounter: Payer: Self-pay | Admitting: Physician Assistant

## 2016-10-06 NOTE — Progress Notes (Addendum)
Cardiology Office Note    Date:  10/07/2016  ID:  Madeline Atkinson, DOB [birth year], MRN [id number] PCP:  Emeterio Reeve, DO  Cardiologist: Dr. Radford Pax   Chief Complaint: f/u CAD  History of Present Illness:  Madeline Atkinson is a 83 y.o. female with history of GERD, dementia, chronic combined systolic/diastolic CHF (EF 80-88%), mild-moderate AI, severe pulm HTN, CAD (remote MI, then NSTEMI 10/2014 managed medically),  CKD stage III by labs, mild anemia, hyperlipidemia who presents for 6 month follow-up. Per Dr. Theodosia Blender note, she had MI years ago and then in October of 2016 with no cath due to advanced age. Prior anginal equivalent was upper abdominal pain. 2D echo 02/2015: EF 10-15%, severe global diffuse HK, extensive anterior, anteroseptal, septal and apical akinesis, Atkinson 2 DD, mildly reduced RV function, akinesis of RV apex, severely dilated LA/RA, mild MR, mild-mod AI, mild TR, severe pulm HTN with PASP 72mHg. She established care with Dr. TRadford Paxin 02/2016 and was doing well at that time. Last available labs from 2017 showed Cr 1.2, LDL 57, Hgb 11.7, plt 149.   She returns for follow-up with her daughter. From cardiovascular standpoint she has been doing well. She denies any CP. Breathing has been stable. She ambulates slowly with a cane. She has been followed by opthalmology for fluid buildup in her eye. She sleeps chronically on multiple pillows due to acid reflux. She has had rare nights of waking up coughing but during these nights, her daughter notices she's slipped down into the bed off her incline. The patient's memory seems to be worsening over the last few months. She does not usually get confused, but has issues with short-term memory loss or difficulty communicating her emotions. The patient herself is cognizant of this. No edema or syncope. No lightheadedness. Daughter is managing meds. The patient attends an adult daycare 5 days a week and is active in their exercise program without any  functional limitation. Her weight has been stable at home.   Past Medical History:  Diagnosis Date  . Acid reflux   . Allergic rhinitis   . Alzheimer disease   . Anxiety   . Aortic insufficiency    a. mild to moderate by echo 02/2015.  . Back pain   . CAD (coronary artery disease), native coronary artery    NSTEMI 10/2014 - medical management due to frailty.  . Chronic back pain   . Chronic combined systolic and diastolic CHF (congestive heart failure) (HCC)    a. EF 20-25% with severe global LV dysfunction 10/2014. b. repeat echo 02/2015 with EF 111-03% Atkinson 2 diastolic dysfunction, mild RV dysfunctoin with akinetic RV, severe biatrial enlargement.  . CKD (chronic kidney disease), stage III   . Cold intolerance   . Depression   . DNR (do not resuscitate)   . Essential hypertension   . GERD (gastroesophageal reflux disease)   . H. pylori infection   . Hyperlipidemia LDL goal <70 03/17/2016  . Mastodynia   . MI, old   . Mild anemia   . OA (osteoarthritis)   . Pain in gums   . Pulmonary hypertension ([place]    a. PASP 635mg by echo 02/2015  . Seasonal allergies   . Tremor   . Urinary incontinence   . Vitamin D deficiency     Past Surgical History:  Procedure Laterality Date  . BREAST LUMPECTOMY    . CHOLECYSTECTOMY      Current Medications: Current Meds  Medication Sig  . aspirin 81  MG tablet Take 81 mg by mouth daily.  Marland Kitchen atorvastatin (LIPITOR) 20 MG tablet Take 1 tablet (20 mg total) by mouth daily.  . carvedilol (COREG) 3.125 MG tablet Take 1 tablet (3.125 mg total) by mouth 2 (two) times daily with a meal.  . chlorhexidine gluconate, MEDLINE KIT, (PERIDEX) 0.12 % solution Use as directed 15 mLs in the mouth or throat 2 (two) times daily.  . Cholecalciferol (VITAMIN D PO) Take by mouth. Vitamin D Hydroxy 25 daily  . clotrimazole (LOTRIMIN) 1 % cream Apply 1 application topically 2 (two) times daily.  . [facility] NAPROXEN SODIUM PO Take 220 mg by mouth every 8 (eight) hours  as needed.  . diclofenac sodium (VOLTAREN) 1 % GEL Apply 4 g topically 4 (four) times daily. Apply to affected painful area  . donepezil (ARICEPT) 10 MG tablet Take 1 tablet (10 mg total) by mouth at bedtime.  . furosemide (LASIX) 40 MG tablet TAKE 1 TABLET (40 MG TOTAL) BY MOUTH DAILY.  Marland Kitchen gatifloxacin (ZYMAXID) 0.5 % SOLN Place 1 drop into the right eye 4 (four) times daily. 2 DAYS AFTER EYE INJECTIONS  . ipratropium (ATROVENT) 0.03 % nasal spray Place 1-2 sprays into both nostrils 4 (four) times daily.  . isosorbide mononitrate (IMDUR) 30 MG 24 hr tablet TAKE 1 TABLET (30 MG TOTAL) BY MOUTH DAILY.  Marland Kitchen lisinopril (PRINIVIL,ZESTRIL) 2.5 MG tablet TAKE 1 TABLET (2.5 MG TOTAL) BY MOUTH DAILY.  Marland Kitchen omeprazole (PRILOSEC) 40 MG capsule Take 1 capsule (40 mg total) by mouth daily.  Marland Kitchen spironolactone (ALDACTONE) 25 MG tablet TAKE 1 TABLET (25 MG TOTAL) BY MOUTH DAILY.  . traZODone (DESYREL) 100 MG tablet Take 1 tablet (100 mg total) by mouth at bedtime.     Allergies:   Codeine   Social History   Social History  . Marital status: Widowed    Spouse name: N/A  . Number of children: N/A  . Years of education: N/A   Social History Main Topics  . Smoking status: Never Smoker  . Smokeless tobacco: Never Used  . Alcohol use No  . Drug use: No  . Sexual activity: No   Other Topics Concern  . None   Social History Narrative  . None     Family History:  Family History  Problem Relation Age of Onset  . Heart disease Mother   . Heart failure Mother   . Heart disease Father   . Heart attack Father     ROS:   Please see the history of present illness.  All other systems are reviewed and otherwise negative.    PHYSICAL EXAM:   VS:  BP 108/60   Pulse 66   Ht 5' 5"  (1.651 m)   Wt 157 lb (71.2 kg)   BMI 26.13 kg/m   BMI: Body mass index is 26.13 kg/m. GEN: Frail-appearance elderly AAF, in no acute distress  HEENT: normocephalic, atraumatic Neck: no JVD, carotid bruits, or  masses Cardiac: RRR; no murmurs, rubs, or gallops, no edema  Respiratory: clear to auscultation bilaterally, normal work of breathing GI: soft, nontender, nondistended, + BS MS: kyphotic posture Skin: warm and dry, no rash Neuro:  Alert and Oriented x 3, Strength and sensation are intact, follows commands Psych: euthymic mood, full affect  Wt Readings from Last 3 Encounters:  10/07/16 157 lb (71.2 kg)  06/18/16 159 lb (72.1 kg)  04/19/16 160 lb (72.6 kg)      Studies/Labs Reviewed:   EKG:  EKG was ordered  today and personally reviewed by me and demonstrates NSR 66bpm 1st degree AV block, occ PVC, nonspecific IVCD, nonspecific diffuse TW changes - similar to prior.  Recent Labs: 10/31/2015: ALT 15; Creatinine 1.2; Potassium 4.2; Sodium 134   Lipid Panel    Component Value Date/Time   CHOL 129 10/31/2015   TRIG 137 10/31/2015   HDL 45 10/31/2015   LDLCALC 57 10/31/2015    Additional studies/ records that were reviewed today include: Summarized above.   ASSESSMENT & PLAN:   1. CAD - doing well without recent angina. Continue current regimen. Check screening CBC to ensure OK to continue aspirin. 2. Chronic combined CHF - despite severe LV dysfunction she is doing remarkably well. Her CHF regimen is optimized to the best of our ability. Her BP is running on the softer side but this is similar to when seen by PCP earlier this year and she is asymptomatic. Will obtain f/u labs today to help inform if any med reductions are necessary. Discussed 2g sodium/fluid restriction, daily weights. Handicap placard application was signed today given her need for assistive device. Encouraged her to remain physically active. 3. Aortic insufficiency - Given her advanced age, dementia and asymptomatic nature, would follow clinically for now. 4. Severe pulm HTN - asymptomatic. Follow clinically.  5. Hyperlipidemia - recheck lipids/LFTs today. She only had yogurt and black coffee.  Disposition:  F/u with Dr. Radford Pax in 6 months.  Medication Adjustments/Labs and Tests Ordered: Current medicines are reviewed at length with the patient today.  Concerns regarding medicines are outlined above. Medication changes, Labs and Tests ordered today are summarized above and listed in the Patient Instructions accessible in Encounters.   Signed, Charlie Pitter, PA-C  10/07/2016 9:44 AM    [facility], [place], [place]  [address] Phone: [phone]; Fax: [phone]

## 2016-10-07 ENCOUNTER — Other Ambulatory Visit: Payer: Self-pay | Admitting: *Deleted

## 2016-10-07 ENCOUNTER — Ambulatory Visit (INDEPENDENT_AMBULATORY_CARE_PROVIDER_SITE_OTHER): Payer: Medicare HMO | Admitting: Physician Assistant

## 2016-10-07 ENCOUNTER — Encounter: Payer: Self-pay | Admitting: Physician Assistant

## 2016-10-07 VITALS — BP 108/60 | HR 66 | Ht 65.0 in | Wt 157.0 lb

## 2016-10-07 DIAGNOSIS — I272 Pulmonary hypertension, unspecified: Secondary | ICD-10-CM | POA: Diagnosis not present

## 2016-10-07 DIAGNOSIS — I2510 Atherosclerotic heart disease of native coronary artery without angina pectoris: Secondary | ICD-10-CM | POA: Diagnosis not present

## 2016-10-07 DIAGNOSIS — I351 Nonrheumatic aortic (valve) insufficiency: Secondary | ICD-10-CM

## 2016-10-07 DIAGNOSIS — I5042 Chronic combined systolic (congestive) and diastolic (congestive) heart failure: Secondary | ICD-10-CM | POA: Diagnosis not present

## 2016-10-07 DIAGNOSIS — E785 Hyperlipidemia, unspecified: Secondary | ICD-10-CM | POA: Diagnosis not present

## 2016-10-07 LAB — COMPREHENSIVE METABOLIC PANEL
A/G RATIO: 1.2 (ref 1.2–2.2)
ALT: 11 IU/L (ref 0–32)
AST: 17 IU/L (ref 0–40)
Albumin: 4 g/dL (ref 3.2–4.6)
Alkaline Phosphatase: 49 IU/L (ref 39–117)
BILIRUBIN TOTAL: 0.3 mg/dL (ref 0.0–1.2)
BUN/Creatinine Ratio: 13 (ref 12–28)
BUN: 32 mg/dL (ref 10–36)
CHLORIDE: 98 mmol/L (ref 96–106)
CO2: 22 mmol/L (ref 20–29)
Calcium: 9.5 mg/dL (ref 8.7–10.3)
Creatinine, Ser: 2.54 mg/dL — ABNORMAL HIGH (ref 0.57–1.00)
GFR, EST AFRICAN AMERICAN: 19 mL/min/{1.73_m2} — AB (ref >59)
GFR, EST NON AFRICAN AMERICAN: 16 mL/min/{1.73_m2} — AB (ref >59)
GLOBULIN, TOTAL: 3.3 g/dL (ref 1.5–4.5)
Glucose: 93 mg/dL (ref 65–99)
POTASSIUM: 4.7 mmol/L (ref 3.5–5.2)
SODIUM: 133 mmol/L — AB (ref 134–144)
TOTAL PROTEIN: 7.3 g/dL (ref 6.0–8.5)

## 2016-10-07 LAB — LIPID PANEL
CHOL/HDL RATIO: 2.8 ratio (ref 0.0–4.4)
Cholesterol, Total: 130 mg/dL (ref 100–199)
HDL: 47 mg/dL (ref >39)
LDL Calculated: 67 mg/dL (ref 0–99)
Triglycerides: 79 mg/dL (ref 0–149)
VLDL Cholesterol Cal: 16 mg/dL (ref 5–40)

## 2016-10-07 LAB — CBC
HEMATOCRIT: 30.2 % — AB (ref 34.0–46.6)
HEMOGLOBIN: 9.9 g/dL — AB (ref 11.1–15.9)
MCH: 28.7 pg (ref 26.6–33.0)
MCHC: 32.8 g/dL (ref 31.5–35.7)
MCV: 88 fL (ref 79–97)
Platelets: 174 10*3/uL (ref 150–379)
RBC: 3.45 x10E6/uL — AB (ref 3.77–5.28)
RDW: 13.4 % (ref 12.3–15.4)
WBC: 5.1 10*3/uL (ref 3.4–10.8)

## 2016-10-07 NOTE — Patient Instructions (Addendum)
Medication Instructions:  Your physician recommends that you continue on your current medications as directed. Please refer to the Current Medication list given to you today.   Labwork: TODAY:  LIPID, CMET, & CBC  Testing/Procedures: None ordered  Follow-Up: Your physician wants you to follow-up in: [place] DR. Mallie Snooks will receive a reminder letter in the mail two months in advance. If you don't receive a letter, please call our office to schedule the follow-up appointment.    Any Other Special Instructions Will Be Listed Below (If Applicable).     If you need a refill on your cardiac medications before your next appointment, please call your pharmacy.

## 2016-10-08 ENCOUNTER — Telehealth: Payer: Self-pay | Admitting: Physician Assistant

## 2016-10-08 NOTE — Telephone Encounter (Signed)
New message   Pt daughter calling and is wondering why she was told that she has to follow up with her PCP due to abnormal labs. She states they moved from [facility] and the PCP is not affiliated with any of the medications her mother is on. She requests a call back with clarification.

## 2016-10-08 NOTE — Telephone Encounter (Signed)
Daughter will call mother's PCP today to schedule her appointment as soon as possible.She will have repeat bmet done at PCP.Copy of lab faxed to PCP Dr.Natlie Alexander.

## 2016-10-08 NOTE — Telephone Encounter (Signed)
Returned call to patient's daughter [name].Advised mother needs to see PCP due to low Hgb.She will need repeat bmet done in 5 days

## 2016-10-11 ENCOUNTER — Ambulatory Visit (INDEPENDENT_AMBULATORY_CARE_PROVIDER_SITE_OTHER): Payer: Medicare HMO | Admitting: Osteopathic Medicine

## 2016-10-11 ENCOUNTER — Encounter: Payer: Self-pay | Admitting: Osteopathic Medicine

## 2016-10-11 VITALS — BP 121/65 | HR 80 | Wt 154.0 lb

## 2016-10-11 DIAGNOSIS — N184 Chronic kidney disease, stage 4 (severe): Secondary | ICD-10-CM

## 2016-10-11 DIAGNOSIS — Z23 Encounter for immunization: Secondary | ICD-10-CM

## 2016-10-11 DIAGNOSIS — Z0189 Encounter for other specified special examinations: Secondary | ICD-10-CM | POA: Diagnosis not present

## 2016-10-11 DIAGNOSIS — D649 Anemia, unspecified: Secondary | ICD-10-CM

## 2016-10-11 DIAGNOSIS — R8299 Other abnormal findings in urine: Secondary | ICD-10-CM

## 2016-10-11 DIAGNOSIS — I5042 Chronic combined systolic (congestive) and diastolic (congestive) heart failure: Secondary | ICD-10-CM

## 2016-10-11 DIAGNOSIS — R82998 Other abnormal findings in urine: Secondary | ICD-10-CM

## 2016-10-11 LAB — POCT GLYCOSYLATED HEMOGLOBIN (HGB A1C): HEMOGLOBIN A1C: 6.1

## 2016-10-11 NOTE — Progress Notes (Signed)
HPI: Madeline Atkinson is a 83 y.o. female  who presents to [facility] today, 10/11/16,  for chief complaint of:  Chief Complaint  Patient presents with  . Follow-up    abnormal labs at cardiologist    Renal function decreased - known mild chronic kidney disease but significantly worsening creatinine  Low hemoglobin - Decreased from previous which is only mild anemia down to 9.9 hemoglobin.  Never got labs done which were ordered in 02/2016.     Past medical, surgical, social and family history reviewed: Patient Active Problem List   Diagnosis Date Noted  . Allergic rhinitis   . Anxiety   . Chronic back pain   . Alzheimer disease   . Depression   . Tremor   . Benign essential HTN   . Mastodynia   . OA (osteoarthritis)   . Urinary incontinence   . H. pylori infection   . Pulmonary hypertension ([place])   . Aortic insufficiency   . DNR (do not resuscitate)   . Chronic combined systolic and diastolic heart failure ([place]) 03/17/2016  . Hyperlipidemia LDL goal <70 03/17/2016  . History of MI (myocardial infarction) 02/19/2016  . Coronary artery disease involving native coronary artery of native heart without angina pectoris 02/19/2016  . Dementia 02/19/2016  . GERD (gastroesophageal reflux disease) 02/19/2016  . Back pain 02/19/2016  . Cold intolerance 02/19/2016   Past Surgical History:  Procedure Laterality Date  . BREAST LUMPECTOMY    . CHOLECYSTECTOMY     Social History  Substance Use Topics  . Smoking status: Never Smoker  . Smokeless tobacco: Never Used  . Alcohol use No   Family History  Problem Relation Age of Onset  . Heart disease Mother   . Heart failure Mother   . Heart disease Father   . Heart attack Father      Current medication list and allergy/intolerance information reviewed:   Current Outpatient Prescriptions  Medication Sig Dispense Refill  . aspirin 81 MG tablet Take 81 mg by mouth daily.    Marland Kitchen atorvastatin  (LIPITOR) 20 MG tablet Take 1 tablet (20 mg total) by mouth daily. 90 tablet 3  . carvedilol (COREG) 3.125 MG tablet Take 1 tablet (3.125 mg total) by mouth 2 (two) times daily with a meal. 180 tablet 3  . chlorhexidine gluconate, MEDLINE KIT, (PERIDEX) 0.12 % solution Use as directed 15 mLs in the mouth or throat 2 (two) times daily. 473 mL 1  . Cholecalciferol (VITAMIN D PO) Take by mouth. Vitamin D Hydroxy 25 daily    . clotrimazole (LOTRIMIN) 1 % cream Apply 1 application topically 2 (two) times daily. 40 g 1  . diclofenac sodium (VOLTAREN) 1 % GEL Apply 4 g topically 4 (four) times daily. Apply to affected painful area 100 g 3  . donepezil (ARICEPT) 10 MG tablet Take 1 tablet (10 mg total) by mouth at bedtime. 90 tablet 1  . furosemide (LASIX) 40 MG tablet TAKE 1 TABLET (40 MG TOTAL) BY MOUTH DAILY. 90 tablet 1  . gatifloxacin (ZYMAXID) 0.5 % SOLN Place 1 drop into the right eye 4 (four) times daily. 2 DAYS AFTER EYE INJECTIONS    . ipratropium (ATROVENT) 0.03 % nasal spray Place 1-2 sprays into both nostrils 4 (four) times daily. 30 mL 0  . isosorbide mononitrate (IMDUR) 30 MG 24 hr tablet TAKE 1 TABLET (30 MG TOTAL) BY MOUTH DAILY. 90 tablet 1  . lisinopril (PRINIVIL,ZESTRIL) 2.5 MG tablet TAKE 1  TABLET (2.5 MG TOTAL) BY MOUTH DAILY. 90 tablet 1  . omeprazole (PRILOSEC) 40 MG capsule Take 1 capsule (40 mg total) by mouth daily. 90 capsule 3  . traZODone (DESYREL) 100 MG tablet Take 1 tablet (100 mg total) by mouth at bedtime. 90 tablet 3   No current facility-administered medications for this visit.    Allergies  Allergen Reactions  . Codeine Nausea And Vomiting      Review of Systems:  Constitutional:  No  fever, no chills, No recent illness, +significant fatigue.   HEENT: No  headache, no vision change  Cardiac: No  chest pain, No  pressure, No palpitations, No  Orthopnea  Respiratory:  No  shortness of breath. No  Cough  Gastrointestinal: No  abdominal pain, No  nausea, No   vomiting,  No  blood in stool  Musculoskeletal: No new myalgia/arthralgia  Hem/Onc: No  easy bruising/bleeding   Neurologic: No  weakness, No  dizziness  Psychiatric: No  concerns with depression, No  concerns with anxiety  Exam:  BP 121/65   Pulse 80   Wt 154 lb (69.9 kg)   BMI 25.63 kg/m   Constitutional: VS see above. General Appearance: alert, well-developed, well-nourished, NAD  Eyes: Normal lids and conjunctive, non-icteric sclera  Ears, Nose, Mouth, Throat: MMM, Normal external inspection ears/nares/mouth/lips/gums.  Neck: No masses, trachea midline.  Respiratory: Normal respiratory effort. no wheeze, no rhonchi, no rales  Cardiovascular: S1/S2 normal, +murmur, no rub/gallop auscultated. RRR. No lower extremity edema.   Musculoskeletal: Gait normal. No clubbing/cyanosis of digits.   Neurological: Normal balance/coordination. No tremor.     No results found for this or any previous visit (from the past 72 hour(s)).  No results found.   ASSESSMENT/PLAN:   Anemia, unspecified type - Differential includes anemia due to CKD, more concerning would be blood loss - Plan: Reticulocytes, TSH, Fe+TIBC+Fer, Pathologist smear review, CBC with Differential/Platelet, Fecal Globin By Immunochem,Medicare  CKD (chronic kidney disease) stage 4, GFR 15-29 ml/min (HCC) - CT stage IV versus acute kidney injury. At any rate, concern for cardiorenal syndrome certainly warrants referral to nephrology. - Plan: COMPLETE METABOLIC PANEL WITH GFR, Phosphorus, VITAMIN D 25 Hydroxy (Vit-D Deficiency, Fractures), Urinalysis, Routine w reflex microscopic, Microalbumin / creatinine urine ratio  Chronic combined systolic and diastolic heart failure (HCC) - Doing okay off of spironolactone, no shortness of breath. She does report some fatigue.  Patient request for diagnostic testing - Plan: POCT HgB A1C  Need for immunization against influenza - Plan: Flu vaccine HIGH DOSE PF      Visit  summary with medication list and pertinent instructions was printed for patient to review. All questions at time of visit were answered - patient instructed to contact office with any additional concerns. ER/RTC precautions were reviewed with the patient. Follow-up plan: Return in about 1 week (around 10/18/2016) for review lab results, sooner if needed .  Note: Total time spent 25 minutes, greater than 50% of the visit was spent face-to-face counseling and coordinating care for the following: The primary encounter diagnosis was Anemia, unspecified type. Diagnoses of CKD (chronic kidney disease) stage 4, GFR 15-29 ml/min (HCC), Chronic combined systolic and diastolic heart failure [facility]), Patient request for diagnostic testing, and Need for immunization against influenza were also pertinent to this visit.Marland Kitchen

## 2016-10-12 DIAGNOSIS — D649 Anemia, unspecified: Secondary | ICD-10-CM | POA: Diagnosis not present

## 2016-10-12 DIAGNOSIS — N184 Chronic kidney disease, stage 4 (severe): Secondary | ICD-10-CM | POA: Diagnosis not present

## 2016-10-12 LAB — IRON,TIBC AND FERRITIN PANEL
%SAT: 33 % (ref 11–50)
FERRITIN: 309 ng/mL — AB (ref 20–288)
IRON: 78 ug/dL (ref 45–160)
TIBC: 240 mcg/dL (calc) — ABNORMAL LOW (ref 250–450)

## 2016-10-12 LAB — COMPLETE METABOLIC PANEL WITH GFR
AG RATIO: 1.1 (calc) (ref 1.0–2.5)
ALT: 10 U/L (ref 6–29)
AST: 17 U/L (ref 10–35)
Albumin: 3.8 g/dL (ref 3.6–5.1)
Alkaline phosphatase (APISO): 47 U/L (ref 33–130)
BUN/Creatinine Ratio: 11 (calc) (ref 6–22)
BUN: 24 mg/dL (ref 7–25)
CO2: 25 mmol/L (ref 20–32)
Calcium: 9.4 mg/dL (ref 8.6–10.4)
Chloride: 101 mmol/L (ref 98–110)
Creat: 2.18 mg/dL — ABNORMAL HIGH (ref 0.60–0.88)
GFR, EST AFRICAN AMERICAN: 22 mL/min/{1.73_m2} — AB (ref >=60)
GFR, EST NON AFRICAN AMERICAN: 19 mL/min/{1.73_m2} — AB (ref >=60)
GLUCOSE: 90 mg/dL (ref 65–99)
Globulin: 3.4 g/dL (calc) (ref 1.9–3.7)
Potassium: 4.7 mmol/L (ref 3.5–5.3)
SODIUM: 134 mmol/L — AB (ref 135–146)
Total Bilirubin: 0.5 mg/dL (ref 0.2–1.2)
Total Protein: 7.2 g/dL (ref 6.1–8.1)

## 2016-10-12 LAB — CBC WITH DIFFERENTIAL/PLATELET
BASOS PCT: 0.7 %
Basophils Absolute: 39 cells/uL (ref 0–200)
EOS ABS: 50 {cells}/uL (ref 15–500)
Eosinophils Relative: 0.9 %
HEMATOCRIT: 30.2 % — AB (ref 35.0–45.0)
HEMOGLOBIN: 10 g/dL — AB (ref 11.7–15.5)
LYMPHS ABS: 1663 {cells}/uL (ref 850–3900)
MCH: 28.6 pg (ref 27.0–33.0)
MCHC: 33.1 g/dL (ref 32.0–36.0)
MCV: 86.3 fL (ref 80.0–100.0)
MPV: 10.4 fL (ref 7.5–12.5)
Monocytes Relative: 9.8 %
Neutro Abs: 3298 cells/uL (ref 1500–7800)
Neutrophils Relative %: 58.9 %
PLATELETS: 175 10*3/uL (ref 140–400)
RBC: 3.5 10*6/uL — ABNORMAL LOW (ref 3.80–5.10)
RDW: 12.3 % (ref 11.0–15.0)
TOTAL LYMPHOCYTE: 29.7 %
WBC: 5.6 10*3/uL (ref 3.8–10.8)
WBCMIX: 549 {cells}/uL (ref 200–950)

## 2016-10-12 LAB — RETICULOCYTES
ABS Retic: 38500 cells/uL (ref 20000–8000)
RETIC CT PCT: 1.1 %

## 2016-10-12 LAB — PHOSPHORUS: Phosphorus: 2.6 mg/dL (ref 2.1–4.3)

## 2016-10-12 LAB — PATHOLOGIST SMEAR REVIEW

## 2016-10-12 LAB — VITAMIN D 25 HYDROXY (VIT D DEFICIENCY, FRACTURES): VIT D 25 HYDROXY: 25 ng/mL — AB (ref 30–100)

## 2016-10-12 LAB — TSH: TSH: 2.8 m[IU]/L (ref 0.40–4.50)

## 2016-10-13 LAB — URINALYSIS, ROUTINE W REFLEX MICROSCOPIC
BILIRUBIN URINE: NEGATIVE
Bacteria, UA: NONE SEEN /HPF
GLUCOSE, UA: NEGATIVE
HGB URINE DIPSTICK: NEGATIVE
HYALINE CAST: NONE SEEN /LPF
Ketones, ur: NEGATIVE
NITRITE: NEGATIVE
PROTEIN: NEGATIVE
RBC / HPF: NONE SEEN /HPF (ref 0–2)
Specific Gravity, Urine: 1.009 (ref 1.001–1.03)
pH: 6 (ref 5.0–8.0)

## 2016-10-13 LAB — MICROALBUMIN / CREATININE URINE RATIO
Creatinine, Urine: 101 mg/dL (ref 20–275)
Microalb Creat Ratio: 3 mcg/mg creat (ref <30)
Microalb, Ur: 0.3 mg/dL

## 2016-10-15 NOTE — Addendum Note (Signed)
Addended by: Maryla Morrow on: 10/15/2016 01:46 PM   Modules accepted: Orders

## 2016-10-18 ENCOUNTER — Ambulatory Visit (INDEPENDENT_AMBULATORY_CARE_PROVIDER_SITE_OTHER): Payer: Medicare HMO | Admitting: Osteopathic Medicine

## 2016-10-18 ENCOUNTER — Encounter: Payer: Self-pay | Admitting: Osteopathic Medicine

## 2016-10-18 VITALS — BP 116/66 | HR 83 | Wt 160.0 lb

## 2016-10-18 DIAGNOSIS — D631 Anemia in chronic kidney disease: Secondary | ICD-10-CM

## 2016-10-18 DIAGNOSIS — Z8679 Personal history of other diseases of the circulatory system: Secondary | ICD-10-CM | POA: Diagnosis not present

## 2016-10-18 DIAGNOSIS — R300 Dysuria: Secondary | ICD-10-CM | POA: Diagnosis not present

## 2016-10-18 DIAGNOSIS — R82998 Other abnormal findings in urine: Secondary | ICD-10-CM | POA: Diagnosis not present

## 2016-10-18 DIAGNOSIS — N184 Chronic kidney disease, stage 4 (severe): Secondary | ICD-10-CM

## 2016-10-18 NOTE — Progress Notes (Signed)
HPI: Madeline Atkinson is a 83 y.o. female  who presents to [facility] today, 10/18/16,  for chief complaint of:  Chief Complaint  Patient presents with  . Follow-up    lab review    Renal function decreased - known mild chronic kidney disease but significantly worsening creatinine measured by cardiology. Repeat showed stable around Cr 2. No proteinuria on UA.   Low hemoglobin - Decreased from previous which was only mild anemia, now down to 9.9 hemoglobin. Labs indicate anemia of chronic inflammation rather than blod lossor iron deficiency.   (+)Urine WBC w/ no epithelial cells, but no symptoms. Need UCx, pt declined to get this collected on Friday so now on Monday we got sample in the office. Today no frequency or dysuria, no foul odor to urine.   Never got labs done which were ordered in 02/2016.     Past medical, surgical, social and family history reviewed: Patient Active Problem List   Diagnosis Date Noted  . Allergic rhinitis   . Anxiety   . Chronic back pain   . Alzheimer disease   . Depression   . Tremor   . Benign essential HTN   . Mastodynia   . OA (osteoarthritis)   . Urinary incontinence   . H. pylori infection   . Pulmonary hypertension ([place])   . Aortic insufficiency   . DNR (do not resuscitate)   . Chronic combined systolic and diastolic heart failure ([place]) 03/17/2016  . Hyperlipidemia LDL goal <70 03/17/2016  . History of MI (myocardial infarction) 02/19/2016  . Coronary artery disease involving native coronary artery of native heart without angina pectoris 02/19/2016  . Dementia 02/19/2016  . GERD (gastroesophageal reflux disease) 02/19/2016  . Back pain 02/19/2016  . Cold intolerance 02/19/2016   Past Surgical History:  Procedure Laterality Date  . BREAST LUMPECTOMY    . CHOLECYSTECTOMY     Social History  Substance Use Topics  . Smoking status: Never Smoker  . Smokeless tobacco: Never Used  . Alcohol use No    Family History  Problem Relation Age of Onset  . Heart disease Mother   . Heart failure Mother   . Heart disease Father   . Heart attack Father      Current medication list and allergy/intolerance information reviewed:   Current Outpatient Prescriptions  Medication Sig Dispense Refill  . aspirin 81 MG tablet Take 81 mg by mouth daily.    Marland Kitchen atorvastatin (LIPITOR) 20 MG tablet Take 1 tablet (20 mg total) by mouth daily. 90 tablet 3  . carvedilol (COREG) 3.125 MG tablet Take 1 tablet (3.125 mg total) by mouth 2 (two) times daily with a meal. 180 tablet 3  . chlorhexidine gluconate, MEDLINE KIT, (PERIDEX) 0.12 % solution Use as directed 15 mLs in the mouth or throat 2 (two) times daily. 473 mL 1  . Cholecalciferol (VITAMIN D PO) Take by mouth. Vitamin D Hydroxy 25 daily    . clotrimazole (LOTRIMIN) 1 % cream Apply 1 application topically 2 (two) times daily. 40 g 1  . diclofenac sodium (VOLTAREN) 1 % GEL Apply 4 g topically 4 (four) times daily. Apply to affected painful area 100 g 3  . donepezil (ARICEPT) 10 MG tablet Take 1 tablet (10 mg total) by mouth at bedtime. 90 tablet 1  . furosemide (LASIX) 40 MG tablet TAKE 1 TABLET (40 MG TOTAL) BY MOUTH DAILY. 90 tablet 1  . gatifloxacin (ZYMAXID) 0.5 % SOLN Place 1 drop  into the right eye 4 (four) times daily. 2 DAYS AFTER EYE INJECTIONS    . ipratropium (ATROVENT) 0.03 % nasal spray Place 1-2 sprays into both nostrils 4 (four) times daily. 30 mL 0  . isosorbide mononitrate (IMDUR) 30 MG 24 hr tablet TAKE 1 TABLET (30 MG TOTAL) BY MOUTH DAILY. 90 tablet 1  . lisinopril (PRINIVIL,ZESTRIL) 2.5 MG tablet TAKE 1 TABLET (2.5 MG TOTAL) BY MOUTH DAILY. 90 tablet 1  . omeprazole (PRILOSEC) 40 MG capsule Take 1 capsule (40 mg total) by mouth daily. 90 capsule 3  . traZODone (DESYREL) 100 MG tablet Take 1 tablet (100 mg total) by mouth at bedtime. 90 tablet 3   No current facility-administered medications for this visit.    Allergies  Allergen  Reactions  . Codeine Nausea And Vomiting      Review of Systems:  Constitutional:  No  fever, no chills, No recent illness, +significant fatigue.   HEENT: No  headache, no vision change  Cardiac: No  chest pain, No  pressure, No palpitations, No  Orthopnea  Respiratory:  No  shortness of breath. No  Cough  Gastrointestinal: No  abdominal pain, No  nausea, No  vomiting,  No  blood in stool  Musculoskeletal: No new myalgia/arthralgia  Hem/Onc: No  easy bruising/bleeding   Neurologic: No  weakness, No  dizziness  Psychiatric: No  concerns with depression, No  concerns with anxiety  Exam:  BP 116/66   Pulse 83   Wt 160 lb (72.6 kg)   BMI 26.63 kg/m   Constitutional: VS see above. General Appearance: alert, well-developed, well-nourished, NAD  Eyes: Normal lids and conjunctive, non-icteric sclera  Ears, Nose, Mouth, Throat: MMM, Normal external inspection ears/nares/mouth/lips/gums.  Neck: No masses, trachea midline.  Respiratory: Normal respiratory effort. no wheeze, no rhonchi, no rales  Cardiovascular: S1/S2 normal, +murmur, no rub/gallop auscultated. RRR. No lower extremity edema.   Musculoskeletal: Gait normal. No clubbing/cyanosis of digits.   Neurological: Normal balance/coordination. No tremor.    Recent Results (from the past 2160 hour(s))  Comp Met (CMET)     Status: Abnormal   Collection Time: 10/07/16 10:10 AM  Result Value Ref Range   Glucose 93 65 - 99 mg/dL   BUN 32 10 - 36 mg/dL   Creatinine, Ser 2.54 (H) 0.57 - 1.00 mg/dL   GFR calc non Af Amer 16 (L) >59 mL/min/1.73   GFR calc Af Amer 19 (L) >59 mL/min/1.73   BUN/Creatinine Ratio 13 12 - 28   Sodium 133 (L) 134 - 144 mmol/L   Potassium 4.7 3.5 - 5.2 mmol/L   Chloride 98 96 - 106 mmol/L   CO2 22 20 - 29 mmol/L   Calcium 9.5 8.7 - 10.3 mg/dL   Total Protein 7.3 6.0 - 8.5 g/dL   Albumin 4.0 3.2 - 4.6 g/dL   Globulin, Total 3.3 1.5 - 4.5 g/dL   Albumin/Globulin Ratio 1.2 1.2 - 2.2    Bilirubin Total 0.3 0.0 - 1.2 mg/dL   Alkaline Phosphatase 49 39 - 117 IU/L   AST 17 0 - 40 IU/L   ALT 11 0 - 32 IU/L  Lipid panel     Status: None   Collection Time: 10/07/16 10:10 AM  Result Value Ref Range   Cholesterol, Total 130 100 - 199 mg/dL   Triglycerides 79 0 - 149 mg/dL   HDL 47 >39 mg/dL   VLDL Cholesterol Cal 16 5 - 40 mg/dL   LDL Calculated 67 0 - 99  mg/dL   Chol/HDL Ratio 2.8 0.0 - 4.4 ratio    Comment:                                   T. Chol/HDL Ratio                                             Men  Women                               1/2 Avg.Risk  3.4    3.3                                   Avg.Risk  5.0    4.4                                2X Avg.Risk  9.6    7.1                                3X Avg.Risk 23.4   11.0   CBC     Status: Abnormal   Collection Time: 10/07/16 10:10 AM  Result Value Ref Range   WBC 5.1 3.4 - 10.8 x10E3/uL   RBC 3.45 (L) 3.77 - 5.28 x10E6/uL   Hemoglobin 9.9 (L) 11.1 - 15.9 g/dL   Hematocrit 30.2 (L) 34.0 - 46.6 %   MCV 88 79 - 97 fL   MCH 28.7 26.6 - 33.0 pg   MCHC 32.8 31.5 - 35.7 g/dL   RDW 13.4 12.3 - 15.4 %   Platelets 174 150 - 379 x10E3/uL  POCT HgB A1C     Status: None   Collection Time: 10/11/16 11:14 AM  Result Value Ref Range   Hemoglobin A1C 6.1   Reticulocytes     Status: None   Collection Time: 10/11/16 11:46 AM  Result Value Ref Range   Retic Ct Pct 1.1 %   ABS Retic 38,500 20,000 - 8,000 cells/uL  TSH     Status: None   Collection Time: 10/11/16 11:46 AM  Result Value Ref Range   TSH 2.80 0.40 - 4.50 mIU/L  Fe+TIBC+Fer     Status: Abnormal   Collection Time: 10/11/16 11:46 AM  Result Value Ref Range   Iron 78 45 - 160 mcg/dL   TIBC 240 (L) 250 - 450 mcg/dL (calc)   %SAT 33 11 - 50 % (calc)   Ferritin 309 (H) 20 - 288 ng/mL  Pathologist smear review     Status: None   Collection Time: 10/11/16 11:46 AM  Result Value Ref Range   Path Review      Comment: Myeloid population consists predominantly of  mature segmented neutrophils. Rare atypical lymphs. No immature cells are identified. Anemia with RBCs which appear to be borderline microcytic and hypochromic on smear review. Suggest evaluation for iron deficiency, if clinically indicated. Review of the peripheral smear reveals adequate numbers of platelets. Reviewed by Francis Gaines Rockne Coons, MD  (Electronic Signature on File)     10/12/2016   COMPLETE METABOLIC PANEL WITH  GFR     Status: Abnormal   Collection Time: 10/11/16 11:46 AM  Result Value Ref Range   Glucose, Bld 90 65 - 99 mg/dL    Comment: .            Fasting reference interval .    BUN 24 7 - 25 mg/dL   Creat 2.18 (H) 0.60 - 0.88 mg/dL    Comment: For patients >10 years of age, the reference limit for Creatinine is approximately 13% higher for people identified as African-American. .    GFR, Est Non African American 19 (L) > OR = 60 mL/min/1.17m   GFR, Est African American 22 (L) > OR = 60 mL/min/1.71m  BUN/Creatinine Ratio 11 6 - 22 (calc)   Sodium 134 (L) 135 - 146 mmol/L   Potassium 4.7 3.5 - 5.3 mmol/L   Chloride 101 98 - 110 mmol/L   CO2 25 20 - 32 mmol/L   Calcium 9.4 8.6 - 10.4 mg/dL   Total Protein 7.2 6.1 - 8.1 g/dL   Albumin 3.8 3.6 - 5.1 g/dL   Globulin 3.4 1.9 - 3.7 g/dL (calc)   AG Ratio 1.1 1.0 - 2.5 (calc)   Total Bilirubin 0.5 0.2 - 1.2 mg/dL   Alkaline phosphatase (APISO) 47 33 - 130 U/L   AST 17 10 - 35 U/L   ALT 10 6 - 29 U/L  Phosphorus     Status: None   Collection Time: 10/11/16 11:46 AM  Result Value Ref Range   Phosphorus 2.6 2.1 - 4.3 mg/dL  VITAMIN D 25 Hydroxy (Vit-D Deficiency, Fractures)     Status: Abnormal   Collection Time: 10/11/16 11:46 AM  Result Value Ref Range   Vit D, 25-Hydroxy 25 (L) 30 - 100 ng/mL    Comment: Vitamin D Status         25-OH Vitamin D: . Deficiency:                    <20 ng/mL Insufficiency:             20 - 29 ng/mL Optimal:                 > or = 30 ng/mL . For 25-OH Vitamin D  testing on patients on  D2-supplementation and patients for whom quantitation  of D2 and D3 fractions is required, the QuestAssureD(TM) 25-OH VIT D, (D2,D3), LC/MS/MS is recommended: order  code 92380-045-3219patients >2y24yr . For more information on this test, go to: http://education.questdiagnostics.com/faq/FAQ163 (This link is being provided for  informational/educational purposes only.)   CBC with Differential/Platelet     Status: Abnormal   Collection Time: 10/11/16 11:46 AM  Result Value Ref Range   WBC 5.6 3.8 - 10.8 Thousand/uL   RBC 3.50 (L) 3.80 - 5.10 Million/uL   Hemoglobin 10.0 (L) 11.7 - 15.5 g/dL   HCT 30.2 (L) 35.0 - 45.0 %   MCV 86.3 80.0 - 100.0 fL   MCH 28.6 27.0 - 33.0 pg   MCHC 33.1 32.0 - 36.0 g/dL   RDW 12.3 11.0 - 15.0 %   Platelets 175 140 - 400 Thousand/uL   MPV 10.4 7.5 - 12.5 fL   Neutro Abs 3,298 1,500 - 7,800 cells/uL   Lymphs Abs 1,663 850 - 3,900 cells/uL   WBC mixed population 549 200 - 950 cells/uL   Eosinophils Absolute 50 15 - 500 cells/uL   Basophils Absolute 39 0 - 200 cells/uL   Neutrophils Relative %  58.9 %   Total Lymphocyte 29.7 %   Monocytes Relative 9.8 %   Eosinophils Relative 0.9 %   Basophils Relative 0.7 %  Urinalysis, Routine w reflex microscopic     Status: Abnormal   Collection Time: 10/12/16 12:05 PM  Result Value Ref Range   Color, Urine YELLOW YELLOW   APPearance CLEAR CLEAR   Specific Gravity, Urine 1.009 1.001 - 1.03   pH 6.0 5.0 - 8.0   Glucose, UA NEGATIVE NEGATIVE   Bilirubin Urine NEGATIVE NEGATIVE   Ketones, ur NEGATIVE NEGATIVE   Hgb urine dipstick NEGATIVE NEGATIVE   Protein, ur NEGATIVE NEGATIVE   Nitrite NEGATIVE NEGATIVE   Leukocytes, UA 1+ (A) NEGATIVE   WBC, UA 6-10 (A) 0 - 5 /HPF   RBC / HPF NONE SEEN 0 - 2 /HPF   Squamous Epithelial / LPF 0-5 < OR = 5 /HPF   Bacteria, UA NONE SEEN NONE SEEN /HPF   Hyaline Cast NONE SEEN NONE SEEN /LPF  Microalbumin / creatinine urine ratio     Status: None    Collection Time: 10/12/16 12:05 PM  Result Value Ref Range   Creatinine, Urine 101 20 - 275 mg/dL   Microalb, Ur 0.3 mg/dL    Comment: Reference Range Not established    Microalb Creat Ratio 3 <30 mcg/mg creat    Comment: . The ADA defines abnormalities in albumin excretion as follows: Marland Kitchen Category         Result (mcg/mg creatinine) . Normal                    <30 Microalbuminuria         30-299  Clinical albuminuria   > OR = 300 . The ADA recommends that at least two of three specimens collected within a 3-6 month period be abnormal before considering a patient to be within a diagnostic category.      ASSESSMENT/PLAN:   CKD (chronic kidney disease) stage 4, GFR 15-29 ml/min (HCC) - Plan: Ambulatory referral to Nephrology  Urine WBC increased - Plan: Urine Culture  History of CHF (congestive heart failure) - continue hold spironolactone unless told otherwise by nephro   Anemia due to stage 4 chronic kidney disease ([place])      Visit summary with medication list and pertinent instructions was printed for patient to review. All questions at time of visit were answered - patient instructed to contact office with any additional concerns. ER/RTC precautions were reviewed with the patient. Follow-up plan: Return in about 3 months (around 01/18/2017) for followup blood pressure and labs for kidney function - or as determined by kidney doctor .  Note: Total time spent 15 minutes, greater than 50% of the visit was spent face-to-face counseling and coordinating care for the following: The primary encounter diagnosis was CKD (chronic kidney disease) stage 4, GFR 15-29 ml/min ([place]). Diagnoses of Urine WBC increased, History of CHF (congestive heart failure), and Anemia due to stage 4 chronic kidney disease ([place]) were also pertinent to this visit.Marland Kitchen

## 2016-10-19 LAB — URINE CULTURE
MICRO NUMBER: 81085782
Result:: NO GROWTH
SPECIMEN QUALITY:: ADEQUATE

## 2016-10-20 ENCOUNTER — Telehealth: Payer: Self-pay | Admitting: Osteopathic Medicine

## 2016-10-20 NOTE — Telephone Encounter (Signed)
Pt's daughter was informed. Pt's daughter expressed understanding and is agreeable. Pt's daughter stated that they did not tell her. Bobetta Lime CMA, RT

## 2016-10-20 NOTE — Telephone Encounter (Signed)
Please call patient: I got a message that the kidney doctor would like her to stop taking the lisinopril - don't know if they also contacted her so I'm reaching out just to be sure.

## 2016-10-22 ENCOUNTER — Encounter (INDEPENDENT_AMBULATORY_CARE_PROVIDER_SITE_OTHER): Payer: Medicare HMO | Admitting: Ophthalmology

## 2016-10-29 ENCOUNTER — Encounter (INDEPENDENT_AMBULATORY_CARE_PROVIDER_SITE_OTHER): Payer: Medicare HMO | Admitting: Ophthalmology

## 2016-10-29 DIAGNOSIS — H43813 Vitreous degeneration, bilateral: Secondary | ICD-10-CM

## 2016-10-29 DIAGNOSIS — I10 Essential (primary) hypertension: Secondary | ICD-10-CM

## 2016-10-29 DIAGNOSIS — H34831 Tributary (branch) retinal vein occlusion, right eye, with macular edema: Secondary | ICD-10-CM | POA: Diagnosis not present

## 2016-10-29 DIAGNOSIS — H35033 Hypertensive retinopathy, bilateral: Secondary | ICD-10-CM | POA: Diagnosis not present

## 2016-11-05 ENCOUNTER — Other Ambulatory Visit: Payer: Self-pay | Admitting: Nephrology

## 2016-11-05 DIAGNOSIS — I10 Essential (primary) hypertension: Secondary | ICD-10-CM | POA: Diagnosis not present

## 2016-11-05 DIAGNOSIS — N184 Chronic kidney disease, stage 4 (severe): Secondary | ICD-10-CM | POA: Diagnosis not present

## 2016-11-05 DIAGNOSIS — N2581 Secondary hyperparathyroidism of renal origin: Secondary | ICD-10-CM | POA: Diagnosis not present

## 2016-11-05 DIAGNOSIS — D631 Anemia in chronic kidney disease: Secondary | ICD-10-CM | POA: Diagnosis not present

## 2016-11-05 DIAGNOSIS — R32 Unspecified urinary incontinence: Secondary | ICD-10-CM | POA: Diagnosis not present

## 2016-11-12 ENCOUNTER — Ambulatory Visit
Admission: RE | Admit: 2016-11-12 | Discharge: 2016-11-12 | Disposition: A | Discharge status: Home / Self Care | Payer: Medicare HMO | Source: Ambulatory Visit | Attending: Nephrology | Admitting: Nephrology

## 2016-11-12 DIAGNOSIS — N184 Chronic kidney disease, stage 4 (severe): Secondary | ICD-10-CM

## 2016-11-18 ENCOUNTER — Encounter: Payer: Self-pay | Admitting: Osteopathic Medicine

## 2016-11-18 DIAGNOSIS — N184 Chronic kidney disease, stage 4 (severe): Secondary | ICD-10-CM | POA: Insufficient documentation

## 2016-12-17 ENCOUNTER — Encounter (INDEPENDENT_AMBULATORY_CARE_PROVIDER_SITE_OTHER): Payer: Medicare HMO | Admitting: Ophthalmology

## 2016-12-17 DIAGNOSIS — H34831 Tributary (branch) retinal vein occlusion, right eye, with macular edema: Secondary | ICD-10-CM

## 2016-12-17 DIAGNOSIS — I10 Essential (primary) hypertension: Secondary | ICD-10-CM

## 2016-12-17 DIAGNOSIS — H43813 Vitreous degeneration, bilateral: Secondary | ICD-10-CM

## 2016-12-17 DIAGNOSIS — H35033 Hypertensive retinopathy, bilateral: Secondary | ICD-10-CM | POA: Diagnosis not present

## 2017-01-07 ENCOUNTER — Ambulatory Visit: Payer: Medicare HMO | Admitting: Osteopathic Medicine

## 2017-01-21 ENCOUNTER — Encounter: Payer: Self-pay | Admitting: Osteopathic Medicine

## 2017-01-21 ENCOUNTER — Ambulatory Visit (INDEPENDENT_AMBULATORY_CARE_PROVIDER_SITE_OTHER): Payer: Medicare HMO | Admitting: Osteopathic Medicine

## 2017-01-21 ENCOUNTER — Ambulatory Visit (HOSPITAL_BASED_OUTPATIENT_CLINIC_OR_DEPARTMENT_OTHER)
Admission: RE | Admit: 2017-01-21 | Discharge: 2017-01-21 | Disposition: A | Discharge status: Home / Self Care | Payer: Medicare HMO | Source: Ambulatory Visit | Attending: Osteopathic Medicine | Admitting: Osteopathic Medicine

## 2017-01-21 VITALS — BP 100/55 | HR 63 | Temp 97.9°F | Wt 155.4 lb

## 2017-01-21 DIAGNOSIS — N184 Chronic kidney disease, stage 4 (severe): Secondary | ICD-10-CM | POA: Diagnosis not present

## 2017-01-21 DIAGNOSIS — M79604 Pain in right leg: Secondary | ICD-10-CM | POA: Insufficient documentation

## 2017-01-21 DIAGNOSIS — I959 Hypotension, unspecified: Secondary | ICD-10-CM | POA: Diagnosis not present

## 2017-01-21 DIAGNOSIS — Z8679 Personal history of other diseases of the circulatory system: Secondary | ICD-10-CM | POA: Diagnosis not present

## 2017-01-21 DIAGNOSIS — M79651 Pain in right thigh: Secondary | ICD-10-CM | POA: Diagnosis not present

## 2017-01-21 LAB — COMPLETE METABOLIC PANEL WITH GFR
AG RATIO: 1 (calc) (ref 1.0–2.5)
ALT: 22 U/L (ref 6–29)
AST: 25 U/L (ref 10–35)
Albumin: 3.7 g/dL (ref 3.6–5.1)
Alkaline phosphatase (APISO): 75 U/L (ref 33–130)
BUN/Creatinine Ratio: 10 (calc) (ref 6–22)
BUN: 16 mg/dL (ref 7–25)
CALCIUM: 9.4 mg/dL (ref 8.6–10.4)
CO2: 23 mmol/L (ref 20–32)
Chloride: 103 mmol/L (ref 98–110)
Creat: 1.53 mg/dL — ABNORMAL HIGH (ref 0.60–0.88)
GFR, EST AFRICAN AMERICAN: 34 mL/min/{1.73_m2} — AB (ref >=60)
GFR, EST NON AFRICAN AMERICAN: 30 mL/min/{1.73_m2} — AB (ref >=60)
GLOBULIN: 3.7 g/dL (ref 1.9–3.7)
Glucose, Bld: 90 mg/dL (ref 65–99)
POTASSIUM: 4.8 mmol/L (ref 3.5–5.3)
Sodium: 135 mmol/L (ref 135–146)
Total Bilirubin: 0.4 mg/dL (ref 0.2–1.2)
Total Protein: 7.4 g/dL (ref 6.1–8.1)

## 2017-01-21 LAB — CBC WITH DIFFERENTIAL/PLATELET
Basophils Absolute: 48 cells/uL (ref 0–200)
Basophils Relative: 1.2 %
EOS PCT: 1.2 %
Eosinophils Absolute: 48 cells/uL (ref 15–500)
HCT: 32.3 % — ABNORMAL LOW (ref 35.0–45.0)
Hemoglobin: 10.5 g/dL — ABNORMAL LOW (ref 11.7–15.5)
LYMPHS ABS: 1436 {cells}/uL (ref 850–3900)
MCH: 27.9 pg (ref 27.0–33.0)
MCHC: 32.5 g/dL (ref 32.0–36.0)
MCV: 85.9 fL (ref 80.0–100.0)
MPV: 10.4 fL (ref 7.5–12.5)
Monocytes Relative: 9.4 %
NEUTROS PCT: 52.3 %
Neutro Abs: 2092 cells/uL (ref 1500–7800)
PLATELETS: 222 10*3/uL (ref 140–400)
RBC: 3.76 10*6/uL — AB (ref 3.80–5.10)
RDW: 11.6 % (ref 11.0–15.0)
TOTAL LYMPHOCYTE: 35.9 %
WBC mixed population: 376 cells/uL (ref 200–950)
WBC: 4 10*3/uL (ref 3.8–10.8)

## 2017-01-21 LAB — URINALYSIS, ROUTINE W REFLEX MICROSCOPIC
BILIRUBIN URINE: NEGATIVE
Glucose, UA: NEGATIVE
HGB URINE DIPSTICK: NEGATIVE
KETONES UR: NEGATIVE
NITRITE: NEGATIVE
PROTEIN: NEGATIVE
RBC / HPF: NONE SEEN /HPF (ref 0–2)
Specific Gravity, Urine: 1.009 (ref 1.001–1.03)
pH: 6 (ref 5.0–8.0)

## 2017-01-21 LAB — TSH: TSH: 2.04 mIU/L (ref 0.40–4.50)

## 2017-01-21 MED ORDER — FUROSEMIDE 40 MG PO TABS: 20.0000 mg | ORAL_TABLET | Freq: Every day | ORAL | 1 refills | Status: DC

## 2017-01-21 NOTE — Patient Instructions (Addendum)
Plan:  For low blood pressure Cut Lasix/Furosemide in half Hold Coreg/Carvedilol for now Will get labs as well Recheck blood pressure in office next week early   For leg WIll get ultrasound to look got blood clot or other cause of pain If no clot or other abnormality on ultrasound, will proceed with further workup from there - may be a muscle strain or may require further imaging

## 2017-01-21 NOTE — Progress Notes (Signed)
HPI: Madeline Atkinson is a 84 y.o. female who  has a past medical history of Acid reflux, Allergic rhinitis, Alzheimer disease, Anxiety, Aortic insufficiency, Back pain, CAD (coronary artery disease), native coronary artery, Chronic back pain, Chronic combined systolic and diastolic CHF (congestive heart failure) ([place]), CKD (chronic kidney disease), stage III ([place]), Cold intolerance, Depression, DNR (do not resuscitate), Essential hypertension, GERD (gastroesophageal reflux disease), H. pylori infection, Hyperlipidemia LDL goal <70 (03/17/2016), Mastodynia, MI, old, Mild anemia, OA (osteoarthritis), Pain in gums, Pulmonary hypertension (HCC), Seasonal allergies, Tremor, Urinary incontinence, and Vitamin D deficiency.  she presents to [facility] today, 01/21/17,  for chief complaint of:  Chief Complaint  Patient presents with  . Hypertension    HTN check: BP is low today. No chest pain, dizziness, lightheadedness. Patient reports some fatigue a little bit worse than usual.  Daughter informed us patient went away with her son over Thanksgiving, 4 hrs in car there and 4 hrs back. Concern for swelling in the right lower extremity, medial thigh, looking like fluid buildup behind the knee, concerned about R leg. Is very tender, daughter noticed this when bathing the patient.  CKD4: Hasn't heard back yet from nephrologist about recent renal ultrasound, reviewed results. All questions answered.    Past medical, surgical, social and family history reviewed:  Patient Active Problem List   Diagnosis Date Noted  . CKD (chronic kidney disease) stage 4, GFR 15-29 ml/min (HCC) 11/18/2016  . Allergic rhinitis   . Anxiety   . Chronic back pain   . Alzheimer disease   . Depression   . Tremor   . Benign essential HTN   . Mastodynia   . OA (osteoarthritis)   . Urinary incontinence   . H. pylori infection   . Pulmonary hypertension ([place])   . Aortic insufficiency   . DNR (do  not resuscitate)   . Chronic combined systolic and diastolic heart failure ([place]) 03/17/2016  . Hyperlipidemia LDL goal <70 03/17/2016  . History of MI (myocardial infarction) 02/19/2016  . Coronary artery disease involving native coronary artery of native heart without angina pectoris 02/19/2016  . Dementia 02/19/2016  . GERD (gastroesophageal reflux disease) 02/19/2016  . Back pain 02/19/2016  . Cold intolerance 02/19/2016    Past Surgical History:  Procedure Laterality Date  . BREAST LUMPECTOMY    . CHOLECYSTECTOMY      Social History   Tobacco Use  . Smoking status: Never Smoker  . Smokeless tobacco: Never Used  Substance Use Topics  . Alcohol use: No    Family History  Problem Relation Age of Onset  . Heart disease Mother   . Heart failure Mother   . Heart disease Father   . Heart attack Father      Current medication list and allergy/intolerance information reviewed:    Current Outpatient Medications  Medication Sig Dispense Refill  . aspirin 81 MG tablet Take 81 mg by mouth daily.    Marland Kitchen atorvastatin (LIPITOR) 20 MG tablet Take 1 tablet (20 mg total) by mouth daily. 90 tablet 3  . carvedilol (COREG) 3.125 MG tablet Take 1 tablet (3.125 mg total) by mouth 2 (two) times daily with a meal. 180 tablet 3  . chlorhexidine gluconate, MEDLINE KIT, (PERIDEX) 0.12 % solution Use as directed 15 mLs in the mouth or throat 2 (two) times daily. 473 mL 1  . Cholecalciferol (VITAMIN D PO) Take by mouth. Vitamin D Hydroxy 25 daily    . clotrimazole (LOTRIMIN)  1 % cream Apply 1 application topically 2 (two) times daily. 40 g 1  . diclofenac sodium (VOLTAREN) 1 % GEL Apply 4 g topically 4 (four) times daily. Apply to affected painful area 100 g 3  . donepezil (ARICEPT) 10 MG tablet Take 1 tablet (10 mg total) by mouth at bedtime. 90 tablet 1  . furosemide (LASIX) 40 MG tablet TAKE 1 TABLET (40 MG TOTAL) BY MOUTH DAILY. 90 tablet 1  . gatifloxacin (ZYMAXID) 0.5 % SOLN Place 1 drop into  the right eye 4 (four) times daily. 2 DAYS AFTER EYE INJECTIONS    . ipratropium (ATROVENT) 0.03 % nasal spray Place 1-2 sprays into both nostrils 4 (four) times daily. 30 mL 0  . isosorbide mononitrate (IMDUR) 30 MG 24 hr tablet TAKE 1 TABLET (30 MG TOTAL) BY MOUTH DAILY. 90 tablet 1  . lisinopril (PRINIVIL,ZESTRIL) 2.5 MG tablet TAKE 1 TABLET (2.5 MG TOTAL) BY MOUTH DAILY. 90 tablet 1  . omeprazole (PRILOSEC) 40 MG capsule Take 1 capsule (40 mg total) by mouth daily. 90 capsule 3  . traZODone (DESYREL) 100 MG tablet Take 1 tablet (100 mg total) by mouth at bedtime. 90 tablet 3   No current facility-administered medications for this visit.     Allergies  Allergen Reactions  . Codeine Nausea And Vomiting      Review of Systems:  Constitutional:  No  fever, no chills, No recent illness, No unintentional weight changes. +significant fatigue.   HEENT: No  headache, no vision change  Cardiac: No  chest pain, No  pressure, No palpitations, No  Orthopnea  Respiratory:  No  shortness of breath. No  Cough  Gastrointestinal: No  abdominal pain, No  Nausea, no bloody/dark stool  Musculoskeletal: R leg pain as per HPI  Skin: No  Rash  Endocrine: No cold intolerance,  No heat intolerance.   Neurologic: +genralized weakness fairly stable, No  dizziness,   Psychiatric: No  concerns with depression, No  concerns with anxiety  Exam:  BP (!) 99/56   Pulse 63   Temp 97.9 F (36.6 C) (Oral)   Wt 155 lb 6.4 oz (70.5 kg)   BMI 25.86 kg/m . BP 100/55 on manual repeat.   Constitutional: VS see above. General Appearance: alert, well-developed, well-nourished, NAD  Eyes: Normal lids and conjunctive, non-icteric sclera  Ears, Nose, Mouth, Throat: MMM, Normal external inspection ears/nares/mouth/lips/gums.   Neck: No masses, trachea midline. No thyroid enlargement. No tenderness/mass appreciated. No lymphadenopathy  Respiratory: Normal respiratory effort. no wheeze, no rhonchi, no  rales  Cardiovascular: S1/S2 normal, +murmur, no rub/gallop auscultated. RRR. No lower extremity edema. Mild Tenderness but no edema on right side, negative Homans bilaterally.  Gastrointestinal: Nontender, no masses.   Musculoskeletal: Gait normal. Uses assistance of cane  Neurological: Normal balance/coordination. No tremor.  Skin: warm, dry, intact. No rash/ulcer. No concerning nevi or subq nodules on limited exam.    Psychiatric: Normal judgment/insight. Normal mood and affect.      ASSESSMENT/PLAN: Blood pressure tends to run a little bit low but we'll go ahead and cut back on medications until labs are back and BP recheck next week, she is mostly on these for CHF anyway   Hypotension, unspecified hypotension type - Plan: COMPLETE METABOLIC PANEL WITH GFR, TSH, Urinalysis, Routine w reflex microscopic, CBC with Differential/Platelet  Right leg pain - Plan: US Venous Img Lower Unilateral Right, CANCELED: VAS [place] LOWER EXTREMITY VENOUS (DVT), CANCELED: VAS [place] LOWER EXTREMITY VENOUS (DVT)  CKD (chronic  kidney disease) stage 4, GFR 15-29 ml/min (HCC)  History of CHF (congestive heart failure) - Plan: furosemide (LASIX) 40 MG tablet    Patient Instructions  Plan:  For low blood pressure Cut Lasix/Furosemide in half Hold Coreg/Carvedilol for now Will get labs as well Recheck blood pressure in office next week early   For leg WIll get ultrasound to look got blood clot or other cause of pain If no clot or other abnormality on ultrasound, will proceed with further workup from there - may be a muscle strain or may require further imaging     Visit summary with medication list and pertinent instructions was printed for patient to review. All questions at time of visit were answered - patient instructed to contact office with any additional concerns. ER/RTC precautions were reviewed with the patient.   Follow-up plan: Return in about 3 days (around 01/24/2017) for rechekc blood  pressure and leg .  Note: Total time spent 25 minutes, greater than 50% of the visit was spent face-to-face counseling and coordinating care for the following: The primary encounter diagnosis was Hypotension, unspecified hypotension type. Diagnoses of Right leg pain, CKD (chronic kidney disease) stage 4, GFR 15-29 ml/min (HCC), and History of CHF (congestive heart failure) were also pertinent to this visit.Marland Kitchen  Please note: voice recognition software was used to produce this document, and typos may escape review. Please contact Dr. Sheppard Coil for any needed clarifications.

## 2017-01-24 ENCOUNTER — Ambulatory Visit: Payer: Medicare HMO | Admitting: Osteopathic Medicine

## 2017-01-26 ENCOUNTER — Encounter: Payer: Self-pay | Admitting: Osteopathic Medicine

## 2017-01-26 ENCOUNTER — Ambulatory Visit (INDEPENDENT_AMBULATORY_CARE_PROVIDER_SITE_OTHER): Payer: Medicare HMO

## 2017-01-26 ENCOUNTER — Ambulatory Visit (INDEPENDENT_AMBULATORY_CARE_PROVIDER_SITE_OTHER): Payer: Medicare HMO | Admitting: Osteopathic Medicine

## 2017-01-26 VITALS — BP 120/65 | HR 65 | Wt 156.3 lb

## 2017-01-26 DIAGNOSIS — R251 Tremor, unspecified: Secondary | ICD-10-CM

## 2017-01-26 DIAGNOSIS — M1611 Unilateral primary osteoarthritis, right hip: Secondary | ICD-10-CM | POA: Diagnosis not present

## 2017-01-26 DIAGNOSIS — M79604 Pain in right leg: Secondary | ICD-10-CM

## 2017-01-26 DIAGNOSIS — M25551 Pain in right hip: Secondary | ICD-10-CM | POA: Diagnosis not present

## 2017-01-26 DIAGNOSIS — N184 Chronic kidney disease, stage 4 (severe): Secondary | ICD-10-CM | POA: Diagnosis not present

## 2017-01-26 DIAGNOSIS — Z8679 Personal history of other diseases of the circulatory system: Secondary | ICD-10-CM

## 2017-01-26 MED ORDER — ACETAMINOPHEN 500 MG PO TABS: 500.0000 mg | ORAL_TABLET | Freq: Four times a day (QID) | ORAL | 0 refills | Status: DC | PRN

## 2017-01-26 NOTE — Patient Instructions (Signed)
Continue current medications, can take 40 mg of Lasix if any lower extremity swelling or shortness of breath, but definitely let me know also if this happens.  Given kidney function, we are limited in the kinds of pain medications we can use. Tylenol is safe for the kidneys. I sent in a prescription for this.  We'll get x-rays of leg today, let me know if she would like to try physical therapy. It may also be a good idea to follow up with one of the sports medicine doctors about this, sometimes leg/thigh pain can actually be hip or lower back issues.

## 2017-01-26 NOTE — Progress Notes (Signed)
HPI: Madeline Atkinson is a 84 y.o. female who  has a past medical history of Acid reflux, Allergic rhinitis, Alzheimer disease, Anxiety, Aortic insufficiency, Back pain, CAD (coronary artery disease), native coronary artery, Chronic back pain, Chronic combined systolic and diastolic CHF (congestive heart failure) ([place]), CKD (chronic kidney disease), stage III ([place]), Cold intolerance, Depression, DNR (do not resuscitate), Essential hypertension, GERD (gastroesophageal reflux disease), H. pylori infection, Hyperlipidemia LDL goal <70 (03/17/2016), Mastodynia, MI, old, Mild anemia, OA (osteoarthritis), Pain in gums, Pulmonary hypertension (HCC), Seasonal allergies, Tremor, Urinary incontinence, and Vitamin D deficiency.  she presents to [facility] today, 01/26/17,  for chief complaint of:  Chief Complaint  Patient presents with  . Follow-up - BP and leg pain    HTN check: BP was low at 99/56 on 01/21/2017. No chest pain, dizziness, lightheadedness. Patient reports some fatigue a little bit worse than usual. We D/C beta blockers and cut Lasix in half. Today BP a bit better.   Patient went away with her son over Thanksgiving, 4 hrs in car there and 4 hrs back. Concern for swelling in the right lower extremity, R medial thigh tenderness. DVT US was negative last week. 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 Daughter reports that she seems a bit tender all over. Patient does not complain of any particular joint pain  Tremor: A little bit worse since coming off of the beta blockers. It Asian and daughter are not concerned about this as tremor runs in family. No new gait disturbance       Past medical, surgical, social and family history reviewed:  Patient Active Problem List   Diagnosis Date Noted  . CKD (chronic kidney disease) stage 4, GFR 15-29 ml/min (HCC) 11/18/2016  . Allergic rhinitis   . Anxiety   . Chronic back  pain   . Alzheimer disease   . Depression   . Tremor   . Benign essential HTN   . Mastodynia   . OA (osteoarthritis)   . Urinary incontinence   . H. pylori infection   . Pulmonary hypertension ([place])   . Aortic insufficiency   . DNR (do not resuscitate)   . Chronic combined systolic and diastolic heart failure ([place]) 03/17/2016  . Hyperlipidemia LDL goal <70 03/17/2016  . History of MI (myocardial infarction) 02/19/2016  . Coronary artery disease involving native coronary artery of native heart without angina pectoris 02/19/2016  . Dementia 02/19/2016  . GERD (gastroesophageal reflux disease) 02/19/2016  . Back pain 02/19/2016  . Cold intolerance 02/19/2016    Past Surgical History:  Procedure Laterality Date  . BREAST LUMPECTOMY    . CHOLECYSTECTOMY      Social History   Tobacco Use  . Smoking status: Never Smoker  . Smokeless tobacco: Never Used  Substance Use Topics  . Alcohol use: No    Family History  Problem Relation Age of Onset  . Heart disease Mother   . Heart failure Mother   . Heart disease Father   . Heart attack Father      Current medication list and allergy/intolerance information reviewed:    Current Outpatient Medications  Medication Sig Dispense Refill  . aspirin 81 MG tablet Take 81 mg by mouth daily.    Marland Kitchen atorvastatin (LIPITOR) 20 MG tablet Take 1 tablet (20 mg total) by mouth daily. 90 tablet 3  . chlorhexidine gluconate, MEDLINE KIT, (PERIDEX) 0.12 % solution Use as directed 15  mLs in the mouth or throat 2 (two) times daily. 473 mL 1  . Cholecalciferol (VITAMIN D PO) Take by mouth. Vitamin D Hydroxy 25 daily    . clotrimazole (LOTRIMIN) 1 % cream Apply 1 application topically 2 (two) times daily. 40 g 1  . diclofenac sodium (VOLTAREN) 1 % GEL Apply 4 g topically 4 (four) times daily. Apply to affected painful area 100 g 3  . donepezil (ARICEPT) 10 MG tablet Take 1 tablet (10 mg total) by mouth at bedtime. 90 tablet 1  . furosemide (LASIX) 40  MG tablet Take 0.5 tablets (20 mg total) by mouth daily. 90 tablet 1  . gatifloxacin (ZYMAXID) 0.5 % SOLN Place 1 drop into the right eye 4 (four) times daily. 2 DAYS AFTER EYE INJECTIONS    . isosorbide mononitrate (IMDUR) 30 MG 24 hr tablet TAKE 1 TABLET (30 MG TOTAL) BY MOUTH DAILY. 90 tablet 1  . lisinopril (PRINIVIL,ZESTRIL) 2.5 MG tablet TAKE 1 TABLET (2.5 MG TOTAL) BY MOUTH DAILY. 90 tablet 1  . omeprazole (PRILOSEC) 40 MG capsule Take 1 capsule (40 mg total) by mouth daily. 90 capsule 3  . traZODone (DESYREL) 100 MG tablet Take 1 tablet (100 mg total) by mouth at bedtime. 90 tablet 3   No current facility-administered medications for this visit.     Allergies  Allergen Reactions  . Codeine Nausea And Vomiting      Review of Systems:  Constitutional:  No  fever, no chills, No recent illness, No unintentional weight changes. +significant fatigue about the same as usual.   HEENT: No  headache, no vision change  Cardiac: No  chest pain, No  pressure, No palpitations, No  Orthopnea, no LE swelling   Respiratory:  No  shortness of breath. No  Cough  Gastrointestinal: No  abdominal pain,  Musculoskeletal: R leg pain and generalized myalgia as per HPI  Skin: No  Rash  Neurologic: +genralized weakness fairly stable, No  dizziness, +tremor  Psychiatric: No  concerns with depression, No  concerns with anxiety  Exam:  BP 120/65   Pulse 65   Wt 156 lb 4.8 oz (70.9 kg)   BMI 26.01 kg/m . (BP was 99/56 last visit)  Constitutional: VS see above. General Appearance: alert, well-developed, well-nourished, NAD  Eyes: Normal lids and conjunctive, non-icteric sclera  Ears, Nose, Mouth, Throat: MMM, Normal external inspection ears/nares/mouth/lips/gums.   Neck: No masses, trachea midline. No thyroid enlargement. No tenderness/mass appreciated. No lymphadenopathy  Respiratory: Normal respiratory effort. no wheeze, no rhonchi, no rales  Cardiovascular: S1/S2 normal, +murmur, no  rub/gallop auscultated. RRR. No lower extremity edema.   Gastrointestinal: Nontender, no masses.   Musculoskeletal: Gait normal. Uses assistance of cane  Neurological: Normal balance/coordination. +tremor.  Skin: warm, dry, intact. No rash/ulcer. No concerning nevi or subq nodules on limited exam.    Psychiatric: Normal judgment/insight. Normal mood and affect.      ASSESSMENT/PLAN:   May consider restarting beta blocker but given how low the blood pressure was last time, would hold off on this unless needed, she was already on a pretty low dose for CHF secondary prevention. Risks versus benefits of being on such medications were discussed with the patient and her daughter. Risks outweigh the benefits.   CKD limits use of anti-inflammatories, can try maximum Tylenol for pain as needed   History of CHF (congestive heart failure)  Right leg pain - Plan: DG HIP UNILAT W OR W/O PELVIS 2-3 VIEWS RIGHT, DG FEMUR, MIN 2  VIEWS RIGHT  CKD (chronic kidney disease) stage 4, GFR 15-29 ml/min (HCC)  Tremor - Consider restart beta blocker versus neurology referral     Patient Instructions  Continue current medications, can take 40 mg of Lasix if any lower extremity swelling or shortness of breath, but definitely let me know also if this happens.  Given kidney function, we are limited in the kinds of pain medications we can use. Tylenol is safe for the kidneys. I sent in a prescription for this.  We'll get x-rays of leg today, let me know if she would like to try physical therapy. It may also be a good idea to follow up with one of the sports medicine doctors about this, sometimes leg/thigh pain can actually be hip or lower back issues.     Visit summary with medication list and pertinent instructions was printed for patient to review. All questions at time of visit were answered - patient instructed to contact office with any additional concerns. ER/RTC precautions were reviewed with the  patient.   Follow-up plan: Return in about 4 weeks (around 02/23/2017) for Recheck leg pain, blood pressure, tremor.  Note: Total time spent 25 minutes, greater than 50% of the visit was spent face-to-face counseling and coordinating care for the following: The primary encounter diagnosis was History of CHF (congestive heart failure). Diagnoses of Right leg pain, CKD (chronic kidney disease) stage 4, GFR 15-29 ml/min (HCC), and Tremor were also pertinent to this visit.Marland Kitchen  Please note: voice recognition software was used to produce this document, and typos may escape review. Please contact Dr. Sheppard Coil for any needed clarifications.

## 2017-01-28 ENCOUNTER — Encounter (INDEPENDENT_AMBULATORY_CARE_PROVIDER_SITE_OTHER): Payer: Medicare HMO | Admitting: Ophthalmology

## 2017-02-07 ENCOUNTER — Other Ambulatory Visit: Payer: Self-pay | Admitting: Osteopathic Medicine

## 2017-02-07 DIAGNOSIS — Z8679 Personal history of other diseases of the circulatory system: Secondary | ICD-10-CM

## 2017-02-07 DIAGNOSIS — I2510 Atherosclerotic heart disease of native coronary artery without angina pectoris: Secondary | ICD-10-CM

## 2017-02-10 DIAGNOSIS — H34831 Tributary (branch) retinal vein occlusion, right eye, with macular edema: Secondary | ICD-10-CM | POA: Diagnosis not present

## 2017-02-10 DIAGNOSIS — H25012 Cortical age-related cataract, left eye: Secondary | ICD-10-CM | POA: Diagnosis not present

## 2017-02-10 DIAGNOSIS — H353131 Nonexudative age-related macular degeneration, bilateral, early dry stage: Secondary | ICD-10-CM | POA: Diagnosis not present

## 2017-02-10 DIAGNOSIS — H2512 Age-related nuclear cataract, left eye: Secondary | ICD-10-CM | POA: Diagnosis not present

## 2017-02-11 ENCOUNTER — Other Ambulatory Visit: Payer: Self-pay

## 2017-02-11 ENCOUNTER — Telehealth: Payer: Self-pay

## 2017-02-11 DIAGNOSIS — F5101 Primary insomnia: Secondary | ICD-10-CM

## 2017-02-11 MED ORDER — TRAZODONE HCL 100 MG PO TABS: 100.0000 mg | ORAL_TABLET | Freq: Every day | ORAL | 3 refills | Status: DC

## 2017-02-11 NOTE — Telephone Encounter (Signed)
[facility] requesting refill for spironolactone. It appears rx was  discontinued. Pls advise if refill appropriate. Thanks.

## 2017-02-14 ENCOUNTER — Other Ambulatory Visit: Payer: Self-pay | Admitting: Osteopathic Medicine

## 2017-02-14 DIAGNOSIS — Z8679 Personal history of other diseases of the circulatory system: Secondary | ICD-10-CM

## 2017-02-14 NOTE — Telephone Encounter (Signed)
Spoke to pharmacist [place] at [facility], updated regarding provider's note. Pharmacist provided with cardiologist info. Pharmacy will contact Cardiology office for clarification for spironolactone refill request.

## 2017-02-14 NOTE — Telephone Encounter (Signed)
Last saw cardiology 10/07/2016. That note wasn't clear if she is still taking's spironolactone but doesn't specifically mentioning discontinuing it. I can't see where I intentionally [place] it, it may be something that they marked off of the list so we just stopped it in Epic.  Please have the pharmacy contact the cardiology office: Charlie Pitter, PA-C  Last saw patient 10/07/2016 [address], [place], [place]  [id number] Phone: [phone];

## 2017-02-18 DIAGNOSIS — N2581 Secondary hyperparathyroidism of renal origin: Secondary | ICD-10-CM | POA: Diagnosis not present

## 2017-02-18 DIAGNOSIS — N183 Chronic kidney disease, stage 3 (moderate): Secondary | ICD-10-CM | POA: Diagnosis not present

## 2017-02-18 DIAGNOSIS — I129 Hypertensive chronic kidney disease with stage 1 through stage 4 chronic kidney disease, or unspecified chronic kidney disease: Secondary | ICD-10-CM | POA: Diagnosis not present

## 2017-02-18 DIAGNOSIS — D631 Anemia in chronic kidney disease: Secondary | ICD-10-CM | POA: Diagnosis not present

## 2017-02-18 DIAGNOSIS — E8809 Other disorders of plasma-protein metabolism, not elsewhere classified: Secondary | ICD-10-CM | POA: Diagnosis not present

## 2017-02-22 ENCOUNTER — Encounter: Payer: Self-pay | Admitting: Hematology and Oncology

## 2017-02-25 ENCOUNTER — Ambulatory Visit: Payer: Medicare HMO | Admitting: Osteopathic Medicine

## 2017-02-25 ENCOUNTER — Other Ambulatory Visit: Payer: Self-pay | Admitting: Family Medicine

## 2017-03-01 ENCOUNTER — Ambulatory Visit (HOSPITAL_COMMUNITY)
Admission: RE | Admit: 2017-03-01 | Discharge: 2017-03-01 | Disposition: A | Discharge status: Home / Self Care | Payer: Medicare HMO | Source: Ambulatory Visit | Attending: Hematology and Oncology | Admitting: Hematology and Oncology

## 2017-03-01 ENCOUNTER — Encounter: Payer: Self-pay | Admitting: Hematology and Oncology

## 2017-03-01 ENCOUNTER — Inpatient Hospital Stay: Payer: Medicare HMO | Attending: Hematology and Oncology | Admitting: Hematology and Oncology

## 2017-03-01 ENCOUNTER — Inpatient Hospital Stay: Payer: Medicare HMO

## 2017-03-01 VITALS — BP 132/62 | HR 76 | Temp 98.6°F | Resp 18 | Ht 65.0 in | Wt 154.6 lb

## 2017-03-01 DIAGNOSIS — F0280 Dementia in other diseases classified elsewhere without behavioral disturbance: Secondary | ICD-10-CM | POA: Insufficient documentation

## 2017-03-01 DIAGNOSIS — I252 Old myocardial infarction: Secondary | ICD-10-CM | POA: Insufficient documentation

## 2017-03-01 DIAGNOSIS — N183 Chronic kidney disease, stage 3 (moderate): Secondary | ICD-10-CM | POA: Diagnosis not present

## 2017-03-01 DIAGNOSIS — K219 Gastro-esophageal reflux disease without esophagitis: Secondary | ICD-10-CM | POA: Diagnosis not present

## 2017-03-01 DIAGNOSIS — N644 Mastodynia: Secondary | ICD-10-CM | POA: Insufficient documentation

## 2017-03-01 DIAGNOSIS — D472 Monoclonal gammopathy: Secondary | ICD-10-CM | POA: Diagnosis not present

## 2017-03-01 DIAGNOSIS — D649 Anemia, unspecified: Secondary | ICD-10-CM

## 2017-03-01 DIAGNOSIS — G8929 Other chronic pain: Secondary | ICD-10-CM

## 2017-03-01 DIAGNOSIS — I130 Hypertensive heart and chronic kidney disease with heart failure and stage 1 through stage 4 chronic kidney disease, or unspecified chronic kidney disease: Secondary | ICD-10-CM | POA: Diagnosis not present

## 2017-03-01 DIAGNOSIS — Z7982 Long term (current) use of aspirin: Secondary | ICD-10-CM | POA: Insufficient documentation

## 2017-03-01 DIAGNOSIS — Z9049 Acquired absence of other specified parts of digestive tract: Secondary | ICD-10-CM | POA: Diagnosis not present

## 2017-03-01 DIAGNOSIS — F418 Other specified anxiety disorders: Secondary | ICD-10-CM | POA: Diagnosis not present

## 2017-03-01 DIAGNOSIS — I272 Pulmonary hypertension, unspecified: Secondary | ICD-10-CM | POA: Diagnosis not present

## 2017-03-01 DIAGNOSIS — F419 Anxiety disorder, unspecified: Secondary | ICD-10-CM

## 2017-03-01 DIAGNOSIS — M549 Dorsalgia, unspecified: Secondary | ICD-10-CM | POA: Diagnosis not present

## 2017-03-01 DIAGNOSIS — Z66 Do not resuscitate: Secondary | ICD-10-CM | POA: Diagnosis not present

## 2017-03-01 DIAGNOSIS — M545 Low back pain: Secondary | ICD-10-CM | POA: Diagnosis not present

## 2017-03-01 DIAGNOSIS — G309 Alzheimer's disease, unspecified: Secondary | ICD-10-CM | POA: Diagnosis not present

## 2017-03-01 DIAGNOSIS — E559 Vitamin D deficiency, unspecified: Secondary | ICD-10-CM | POA: Diagnosis not present

## 2017-03-01 DIAGNOSIS — I351 Nonrheumatic aortic (valve) insufficiency: Secondary | ICD-10-CM | POA: Insufficient documentation

## 2017-03-01 DIAGNOSIS — M1990 Unspecified osteoarthritis, unspecified site: Secondary | ICD-10-CM | POA: Insufficient documentation

## 2017-03-01 DIAGNOSIS — E785 Hyperlipidemia, unspecified: Secondary | ICD-10-CM

## 2017-03-01 DIAGNOSIS — I2510 Atherosclerotic heart disease of native coronary artery without angina pectoris: Secondary | ICD-10-CM

## 2017-03-01 DIAGNOSIS — Z79899 Other long term (current) drug therapy: Secondary | ICD-10-CM | POA: Insufficient documentation

## 2017-03-01 DIAGNOSIS — I350 Nonrheumatic aortic (valve) stenosis: Secondary | ICD-10-CM | POA: Diagnosis not present

## 2017-03-01 DIAGNOSIS — I5042 Chronic combined systolic (congestive) and diastolic (congestive) heart failure: Secondary | ICD-10-CM | POA: Diagnosis not present

## 2017-03-01 LAB — CMP (CANCER CENTER ONLY)
ALBUMIN: 3.1 g/dL — AB (ref 3.5–5.0)
ALT: 20 U/L (ref 0–55)
ANION GAP: 10 (ref 3–11)
AST: 28 U/L (ref 5–34)
Alkaline Phosphatase: 69 U/L (ref 40–150)
BILIRUBIN TOTAL: 0.3 mg/dL (ref 0.2–1.2)
BUN: 9 mg/dL (ref 7–26)
CO2: 22 mmol/L (ref 22–29)
Calcium: 9 mg/dL (ref 8.4–10.4)
Chloride: 106 mmol/L (ref 98–109)
Creatinine: 1.3 mg/dL — ABNORMAL HIGH (ref 0.60–1.10)
GFR, Est AFR Am: 41 mL/min — ABNORMAL LOW (ref >60)
GFR, Estimated: 35 mL/min — ABNORMAL LOW (ref >60)
GLUCOSE: 108 mg/dL (ref 70–140)
POTASSIUM: 4.4 mmol/L (ref 3.5–5.1)
Sodium: 138 mmol/L (ref 136–145)
TOTAL PROTEIN: 7.1 g/dL (ref 6.4–8.3)

## 2017-03-01 LAB — CBC WITH DIFFERENTIAL (CANCER CENTER ONLY)
BASOS ABS: 0 10*3/uL (ref 0.0–0.1)
Basophils Relative: 1 %
Eosinophils Absolute: 0 10*3/uL (ref 0.0–0.5)
Eosinophils Relative: 0 %
HEMATOCRIT: 30.4 % — AB (ref 34.8–46.6)
HEMOGLOBIN: 10 g/dL — AB (ref 11.6–15.9)
LYMPHS PCT: 34 %
Lymphs Abs: 1.1 10*3/uL (ref 0.9–3.3)
MCH: 27.9 pg (ref 25.1–34.0)
MCHC: 32.8 g/dL (ref 31.5–36.0)
MCV: 84.9 fL (ref 79.5–101.0)
Monocytes Absolute: 0.3 10*3/uL (ref 0.1–0.9)
Monocytes Relative: 11 %
NEUTROS ABS: 1.7 10*3/uL (ref 1.5–6.5)
Neutrophils Relative %: 54 %
Platelet Count: 161 10*3/uL (ref 145–400)
RBC: 3.58 MIL/uL — AB (ref 3.70–5.45)
RDW: 13.2 % (ref 11.2–14.5)
WBC: 3.2 10*3/uL — AB (ref 3.9–10.3)

## 2017-03-01 LAB — LACTATE DEHYDROGENASE: LDH: 176 U/L (ref 125–245)

## 2017-03-02 LAB — KAPPA/LAMBDA LIGHT CHAINS
KAPPA FREE LGHT CHN: 52.3 mg/L — AB (ref 3.3–19.4)
Kappa, lambda light chain ratio: 3.49 — ABNORMAL HIGH (ref 0.26–1.65)
Lambda free light chains: 15 mg/L (ref 5.7–26.3)

## 2017-03-02 LAB — BETA 2 MICROGLOBULIN, SERUM: BETA 2 MICROGLOBULIN: 2.8 mg/L — AB (ref 0.6–2.4)

## 2017-03-03 ENCOUNTER — Ambulatory Visit (INDEPENDENT_AMBULATORY_CARE_PROVIDER_SITE_OTHER): Payer: Medicare HMO | Admitting: Osteopathic Medicine

## 2017-03-03 ENCOUNTER — Encounter: Payer: Self-pay | Admitting: Osteopathic Medicine

## 2017-03-03 VITALS — BP 137/83 | HR 107 | Wt 152.0 lb

## 2017-03-03 DIAGNOSIS — Z8679 Personal history of other diseases of the circulatory system: Secondary | ICD-10-CM | POA: Diagnosis not present

## 2017-03-03 DIAGNOSIS — N184 Chronic kidney disease, stage 4 (severe): Secondary | ICD-10-CM

## 2017-03-03 DIAGNOSIS — N3090 Cystitis, unspecified without hematuria: Secondary | ICD-10-CM

## 2017-03-03 DIAGNOSIS — R251 Tremor, unspecified: Secondary | ICD-10-CM

## 2017-03-03 DIAGNOSIS — R350 Frequency of micturition: Secondary | ICD-10-CM

## 2017-03-03 LAB — POCT URINALYSIS DIP (MANUAL ENTRY)
BILIRUBIN UA: NEGATIVE
Glucose, UA: NEGATIVE mg/dL
NEGATIVE: NEGATIVE
Nitrite, UA: POSITIVE — AB
PH UA: 7 (ref 5.0–8.0)
SPEC GRAV UA: 1.015 (ref 1.010–1.025)
Urobilinogen, UA: 0.2 E.U./dL

## 2017-03-03 MED ORDER — CIPROFLOXACIN HCL 250 MG PO TABS: 250.0000 mg | ORAL_TABLET | Freq: Two times a day (BID) | ORAL | 0 refills | Status: AC

## 2017-03-03 NOTE — Progress Notes (Addendum)
HPI: Madeline Atkinson is a 84 y.o. female who  has a past medical history of Acid reflux, Allergic rhinitis, Alzheimer disease, Anxiety, Aortic insufficiency, Back pain, CAD (coronary artery disease), native coronary artery, Chronic back pain, Chronic combined systolic and diastolic CHF (congestive heart failure) ([place]), CKD (chronic kidney disease), stage III ([place]), Cold intolerance, Depression, DNR (do not resuscitate), Essential hypertension, GERD (gastroesophageal reflux disease), H. pylori infection, Hyperlipidemia LDL goal <70 (03/17/2016), Mastodynia, MI, old, Mild anemia, OA (osteoarthritis), Pain in gums, Pulmonary hypertension (HCC), Seasonal allergies, Tremor, Urinary incontinence, and Vitamin D deficiency.  she presents to [facility] today, 03/03/17,  for chief complaint of:  Chief Complaint  Patient presents with  . Follow-up - BP and leg pain    HTN check: BP was low at 99/56 on 01/21/2017. No chest pain, dizziness, lightheadedness. Patient reports some fatigue a little bit worse than usual. We D/C beta blockers and cut Lasix in half. Last visit and today BP is looking better, some improved energy levels..   Tremor: A little bit worse since coming off of the beta blockers. She and her daughter are not concerned about this as tremor runs in family. No new gait disturbance.   Recent URI and cough is improving, though seems to be more incontinent since then. Possible d/t coughing. No dysuria, no fevers, no abdominal pain, no increased confusion.     Past medical, surgical, social and family history reviewed:  Patient Active Problem List   Diagnosis Date Noted  . CKD (chronic kidney disease) stage 4, GFR 15-29 ml/min (HCC) 11/18/2016  . Allergic rhinitis   . Anxiety   . Chronic back pain   . Alzheimer disease   . Depression   . Tremor   . Benign essential HTN   . Mastodynia   . OA (osteoarthritis)   . Urinary incontinence   . H. pylori infection    . Pulmonary hypertension ([place])   . Aortic insufficiency   . DNR (do not resuscitate)   . Chronic combined systolic and diastolic heart failure ([place]) 03/17/2016  . Hyperlipidemia LDL goal <70 03/17/2016  . History of MI (myocardial infarction) 02/19/2016  . Coronary artery disease involving native coronary artery of native heart without angina pectoris 02/19/2016  . Dementia 02/19/2016  . GERD (gastroesophageal reflux disease) 02/19/2016  . Back pain 02/19/2016  . Cold intolerance 02/19/2016    Past Surgical History:  Procedure Laterality Date  . BREAST LUMPECTOMY    . CHOLECYSTECTOMY      Social History   Tobacco Use  . Smoking status: Never Smoker  . Smokeless tobacco: Never Used  Substance Use Topics  . Alcohol use: No    Family History  Problem Relation Age of Onset  . Heart disease Mother   . Heart failure Mother   . Heart disease Father   . Heart attack Father      Current medication list and allergy/intolerance information reviewed:    Current Outpatient Medications  Medication Sig Dispense Refill  . acetaminophen (TYLENOL) 500 MG tablet Take 1 tablet (500 mg total) by mouth every 6 (six) hours as needed. 30 tablet 0  . aspirin 81 MG tablet Take 81 mg by mouth daily.    Marland Kitchen atorvastatin (LIPITOR) 20 MG tablet TAKE 1 TABLET (20 MG TOTAL) BY MOUTH DAILY. 90 tablet 3  . chlorhexidine gluconate, MEDLINE KIT, (PERIDEX) 0.12 % solution Use as directed 15 mLs in the mouth or throat 2 (two) times daily. 473 mL  1  . Cholecalciferol (VITAMIN D PO) Take by mouth. Vitamin D Hydroxy 25 daily    . clotrimazole (LOTRIMIN) 1 % cream Apply 1 application topically 2 (two) times daily. 40 g 1  . diclofenac sodium (VOLTAREN) 1 % GEL Apply 4 g topically 4 (four) times daily. Apply to affected painful area 100 g 3  . donepezil (ARICEPT) 10 MG tablet Take 1 tablet (10 mg total) by mouth at bedtime. 90 tablet 1  . furosemide (LASIX) 40 MG tablet Take 0.5 tablets (20 mg total) by mouth  daily. 90 tablet 1  . gatifloxacin (ZYMAXID) 0.5 % SOLN Place 1 drop into the right eye 4 (four) times daily. 2 DAYS AFTER EYE INJECTIONS    . isosorbide mononitrate (IMDUR) 30 MG 24 hr tablet TAKE 1 TABLET (30 MG TOTAL) BY MOUTH DAILY. 90 tablet 1  . lisinopril (PRINIVIL,ZESTRIL) 2.5 MG tablet TAKE 1 TABLET (2.5 MG TOTAL) BY MOUTH DAILY. 90 tablet 1  . omeprazole (PRILOSEC) 40 MG capsule Take 1 capsule (40 mg total) by mouth daily. 90 capsule 3  . traZODone (DESYREL) 100 MG tablet Take 1 tablet (100 mg total) by mouth at bedtime. 90 tablet 3   No current facility-administered medications for this visit.     Allergies  Allergen Reactions  . Codeine Nausea And Vomiting      Review of Systems:  Constitutional:  No  fever, no chills, No recent illness, No unintentional weight changes. +significant fatigue but a little imrpoved   HEENT: No  headache, no vision change  Cardiac: No  chest pain, No  pressure, No palpitations, No  Orthopnea, no LE swelling   Respiratory:  No  shortness of breath. No  Cough  Gastrointestinal: No  abdominal pain  GU: urinary frequency as per HPI  Skin: No  Rash  Neurologic: +genralized weakness fairly stable, No  dizziness, +tremor  Psychiatric: No  concerns with depression, No  concerns with anxiety  Exam:  BP 137/83   Pulse (!) 107   Wt 152 lb (68.9 kg)   SpO2 91%   BMI 25.29 kg/m  HR about 90 on auscultation   Constitutional: VS see above. General Appearance: alert, well-developed, well-nourished, NAD  Eyes: Normal lids and conjunctive, non-icteric sclera  Ears, Nose, Mouth, Throat: MMM, Normal external inspection ears/nares/mouth/lips/gums.   Neck: No masses, trachea midline. No thyroid enlargement. No tenderness/mass appreciated. No lymphadenopathy  Respiratory: Normal respiratory effort. no wheeze, no rhonchi, no rales  Cardiovascular: S1/S2 normal, +murmur, no rub/gallop auscultated. RRR. No lower extremity edema.    Gastrointestinal: Nontender, no masses.   Musculoskeletal: Gait normal. Uses assistance of cane  Neurological: Normal balance/coordination. +tremor.  Skin: warm, dry, intact. No rash/ulcer. No concerning nevi or subq nodules on limited exam.    Psychiatric: Normal judgment/insight. Normal mood and affect.      ASSESSMENT/PLAN:   Urinary frequency  History of CHF (congestive heart failure)  CKD (chronic kidney disease) stage 4, GFR 15-29 ml/min (HCC)  Tremor  Cystitis - renal dose cipro rx given      May consider restarting beta blocker but given how low the blood pressure was last time, would hold off on this unless needed or unless strongly advised by cardiology, she was already on a pretty low dose for CHF secondary prevention. Risks versus benefits of being on such medications were discussed with the patient and her daughter.  Tremor not bothering her enough to want to start a medication again   Will check urine today  for possible UTI - (+)nitrites an dblood, await culture        Patient Instructions  If doing well can come see me in 6 months for annual physical as long as blood pressures are looking okay (top number 110-140, bottom number 70-90) and no weakness or dizziness.   Will call if urine culture shows a need for change in antibiotics.     Visit summary with medication list and pertinent instructions was printed for patient to review. All questions at time of visit were answered - patient instructed to contact office with any additional concerns. ER/RTC precautions were reviewed with the patient.   Follow-up plan: Return in about 6 months (around 08/31/2017) for [facility], sooner if needed .  Note: Total time spent 25 minutes, greater than 50% of the visit was spent face-to-face counseling and coordinating care for the following: The primary encounter diagnosis was Urinary frequency. Diagnoses of History of CHF (congestive heart failure), CKD  (chronic kidney disease) stage 4, GFR 15-29 ml/min (HCC), and Tremor were also pertinent to this visit.Marland Kitchen  Please note: voice recognition software was used to produce this document, and typos may escape review. Please contact Dr. Sheppard Coil for any needed clarifications.

## 2017-03-03 NOTE — Addendum Note (Signed)
Addended by: Tommas Olp B on: 03/03/2017 08:49 AM   Modules accepted: Orders

## 2017-03-03 NOTE — Addendum Note (Signed)
Addended by: Maryla Morrow on: 03/03/2017 08:38 AM   Modules accepted: Orders

## 2017-03-03 NOTE — Patient Instructions (Addendum)
If doing well can come see me in 6 months for annual physical as long as blood pressures are looking okay (top number 110-140, bottom number 70-90) and no weakness or dizziness.   Will call if urine culture shows a need for change in antibiotics.

## 2017-03-04 ENCOUNTER — Ambulatory Visit: Payer: Medicare HMO | Admitting: Osteopathic Medicine

## 2017-03-04 LAB — MULTIPLE MYELOMA PANEL, SERUM
ALBUMIN SERPL ELPH-MCNC: 3.3 g/dL (ref 2.9–4.4)
Albumin/Glob SerPl: 0.9 (ref 0.7–1.7)
Alpha 1: 0.3 g/dL (ref 0.0–0.4)
Alpha2 Glob SerPl Elph-Mcnc: 0.9 g/dL (ref 0.4–1.0)
B-GLOBULIN SERPL ELPH-MCNC: 0.8 g/dL (ref 0.7–1.3)
Gamma Glob SerPl Elph-Mcnc: 1.8 g/dL (ref 0.4–1.8)
Globulin, Total: 3.8 g/dL (ref 2.2–3.9)
IgA: 1493 mg/dL — ABNORMAL HIGH (ref 64–422)
IgG (Immunoglobin G), Serum: 1036 mg/dL (ref 700–1600)
IgM (Immunoglobulin M), Srm: 42 mg/dL (ref 26–217)
M PROTEIN SERPL ELPH-MCNC: 0.9 g/dL — AB
Total Protein ELP: 7.1 g/dL (ref 6.0–8.5)

## 2017-03-06 LAB — URINE CULTURE
MICRO NUMBER:: 90200316
SPECIMEN QUALITY: ADEQUATE

## 2017-03-07 ENCOUNTER — Inpatient Hospital Stay (HOSPITAL_BASED_OUTPATIENT_CLINIC_OR_DEPARTMENT_OTHER): Payer: Medicare HMO | Admitting: Hematology and Oncology

## 2017-03-07 ENCOUNTER — Telehealth: Payer: Self-pay

## 2017-03-07 ENCOUNTER — Other Ambulatory Visit: Payer: Self-pay

## 2017-03-07 ENCOUNTER — Encounter: Payer: Self-pay | Admitting: Hematology and Oncology

## 2017-03-07 ENCOUNTER — Ambulatory Visit: Payer: Medicare HMO

## 2017-03-07 VITALS — BP 135/60 | HR 75 | Temp 98.3°F | Resp 16 | Ht 65.0 in | Wt 154.4 lb

## 2017-03-07 DIAGNOSIS — I351 Nonrheumatic aortic (valve) insufficiency: Secondary | ICD-10-CM | POA: Diagnosis not present

## 2017-03-07 DIAGNOSIS — D472 Monoclonal gammopathy: Secondary | ICD-10-CM

## 2017-03-07 DIAGNOSIS — N644 Mastodynia: Secondary | ICD-10-CM

## 2017-03-07 DIAGNOSIS — I252 Old myocardial infarction: Secondary | ICD-10-CM

## 2017-03-07 DIAGNOSIS — M549 Dorsalgia, unspecified: Secondary | ICD-10-CM

## 2017-03-07 DIAGNOSIS — F0280 Dementia in other diseases classified elsewhere without behavioral disturbance: Secondary | ICD-10-CM | POA: Diagnosis not present

## 2017-03-07 DIAGNOSIS — M1990 Unspecified osteoarthritis, unspecified site: Secondary | ICD-10-CM

## 2017-03-07 DIAGNOSIS — F419 Anxiety disorder, unspecified: Secondary | ICD-10-CM

## 2017-03-07 DIAGNOSIS — E559 Vitamin D deficiency, unspecified: Secondary | ICD-10-CM

## 2017-03-07 DIAGNOSIS — Z7982 Long term (current) use of aspirin: Secondary | ICD-10-CM

## 2017-03-07 DIAGNOSIS — I130 Hypertensive heart and chronic kidney disease with heart failure and stage 1 through stage 4 chronic kidney disease, or unspecified chronic kidney disease: Secondary | ICD-10-CM

## 2017-03-07 DIAGNOSIS — K219 Gastro-esophageal reflux disease without esophagitis: Secondary | ICD-10-CM

## 2017-03-07 DIAGNOSIS — I5042 Chronic combined systolic (congestive) and diastolic (congestive) heart failure: Secondary | ICD-10-CM | POA: Diagnosis not present

## 2017-03-07 DIAGNOSIS — M545 Low back pain: Secondary | ICD-10-CM | POA: Diagnosis not present

## 2017-03-07 DIAGNOSIS — Z8679 Personal history of other diseases of the circulatory system: Secondary | ICD-10-CM

## 2017-03-07 DIAGNOSIS — I2510 Atherosclerotic heart disease of native coronary artery without angina pectoris: Secondary | ICD-10-CM | POA: Diagnosis not present

## 2017-03-07 DIAGNOSIS — I272 Pulmonary hypertension, unspecified: Secondary | ICD-10-CM

## 2017-03-07 DIAGNOSIS — D649 Anemia, unspecified: Secondary | ICD-10-CM

## 2017-03-07 DIAGNOSIS — F418 Other specified anxiety disorders: Secondary | ICD-10-CM

## 2017-03-07 DIAGNOSIS — G309 Alzheimer's disease, unspecified: Secondary | ICD-10-CM | POA: Diagnosis not present

## 2017-03-07 DIAGNOSIS — Z9049 Acquired absence of other specified parts of digestive tract: Secondary | ICD-10-CM

## 2017-03-07 DIAGNOSIS — E785 Hyperlipidemia, unspecified: Secondary | ICD-10-CM

## 2017-03-07 DIAGNOSIS — G8929 Other chronic pain: Secondary | ICD-10-CM

## 2017-03-07 DIAGNOSIS — Z66 Do not resuscitate: Secondary | ICD-10-CM

## 2017-03-07 DIAGNOSIS — Z79899 Other long term (current) drug therapy: Secondary | ICD-10-CM

## 2017-03-07 DIAGNOSIS — N183 Chronic kidney disease, stage 3 (moderate): Secondary | ICD-10-CM

## 2017-03-07 NOTE — Telephone Encounter (Signed)
Per 2/18 los  Return if symptoms worsen or fail to improve.

## 2017-03-07 NOTE — Telephone Encounter (Signed)
[facility] @ [phone] requesting 90 d/s medication refills for isosorbide mono 30 mg & lisinopril 2.5. Pls advise, if appropriate. Thanks.

## 2017-03-08 LAB — UIFE/LIGHT CHAINS/TP QN, 24-HR UR
% BETA, Urine: 0 %
ALBUMIN, U: 100 %
ALPHA 1 URINE: 0 %
ALPHA 2 UR: 0 %
FREE LAMBDA LT CHAINS, UR: 0.3 mg/L (ref 0.24–6.66)
Free Kappa Lt Chains,Ur: 4.66 mg/L (ref 1.35–24.19)
Free Kappa/Lambda Ratio: 15.53 — ABNORMAL HIGH (ref 2.04–10.37)
GAMMA GLOBULIN URINE: 0 %
TOTAL PROTEIN, URINE-UR/DAY: 47 mg/(24.h) (ref 30–150)
Total Protein, Urine: 4.1 mg/dL
Total Volume: 1150

## 2017-03-08 MED ORDER — ISOSORBIDE MONONITRATE ER 30 MG PO TB24: 30.0000 mg | ORAL_TABLET | Freq: Every day | ORAL | 3 refills | Status: DC

## 2017-03-08 MED ORDER — LISINOPRIL 2.5 MG PO TABS: 2.5000 mg | ORAL_TABLET | Freq: Every day | ORAL | 3 refills | Status: DC

## 2017-03-08 NOTE — Telephone Encounter (Signed)
Sent!

## 2017-03-21 DIAGNOSIS — D472 Monoclonal gammopathy: Secondary | ICD-10-CM | POA: Insufficient documentation

## 2017-03-21 NOTE — Progress Notes (Signed)
[name] New Visit:  Assessment: MGUS (monoclonal gammopathy of unknown significance) 84 y.o. female with new discovery of monoclonal gammopathy of unknown significance.  The labs demonstrating the abnormality had been obtained in October.  At this time, I would like to obtain repeat lab work to confirm presence of the laboratory abnormality.  Patient has no significant hypercalcemia, she does have renal dysfunction, but no neuropathy and no significant anemia or thrombocytopenia.  Plan: -Labs today as outlined below. -Skeletal survey. -Return to clinic in 1 week to review the findings.   Voice recognition software was used and creation of this note. Despite my best effort at editing the text, some misspelling/errors may have occurred. Orders Placed This Encounter  Procedures  . DG Bone Survey Met    Standing Status:   Future    Number of Occurrences:   1    Standing Expiration Date:   04/30/2018    Order Specific Question:   Reason for Exam (SYMPTOM  OR DIAGNOSIS REQUIRED)    Answer:   MGUS, undergoing evaluation for possible Multiple myeloma    Order Specific Question:   Preferred imaging location?    Answer:   [facility]    Order Specific Question:   Radiology Contrast Protocol - do NOT remove file path    Answer:   \\[email or web address].[email or web address]  . CBC with Differential ([place] Only)    Standing Status:   Future    Number of Occurrences:   1    Standing Expiration Date:   03/01/2018  . CMP ([place] only)    Standing Status:   Future    Number of Occurrences:   1    Standing Expiration Date:   03/01/2018  . Lactate dehydrogenase (LDH)    Standing Status:   Future    Number of Occurrences:   1    Standing Expiration Date:   03/01/2018  . Beta 2 microglobulin    Standing Status:   Future    Number of Occurrences:   1    Standing Expiration Date:   03/01/2018  . Multiple Myeloma Panel (SPEP&IFE w/QIG)     Standing Status:   Future    Number of Occurrences:   1    Standing Expiration Date:   03/01/2018  . Kappa/lambda light chains    Standing Status:   Future    Number of Occurrences:   1    Standing Expiration Date:   03/01/2018  . 24-Hr Ur UPEP/UIFE/Light Chains/TP    Standing Status:   Future    Number of Occurrences:   1    Standing Expiration Date:   03/01/2018    All questions were answered.  . The patient knows to call the clinic with any problems, questions or concerns.  This note was electronically signed.    History of Presenting Illness Madeline Atkinson 84 y.o. presenting to the [facility] for evaluation of monoclonal gammopathy of unknown significance, referred by Dr. Elmarie Shiley.  Monoclonal gammopathy was found during evaluation for renal dysfunction.  Since the discovery, patient denies having any new symptoms.  Energy level and appetite remain unchanged.  Patient denies any new musculoskeletal pain other than discomfort in the lower right thigh.  Denies numbness, tingling, loss of sensation in hands or feet.  Denies constipation, diarrhea, or urinary retention.  Oncological/hematological History: --Labs, 06/23/15:                   WBC 4.6, Hgb  11.7, MCV 82.1, MCH 27.1, MCHC 33.1, RDW 14.6, Plt 149 --Labs, 10/11/16:                  WBC 5.6, Hgb 10.0, MCV 86.3, MCH 28.6, MCHC 33.1, RDW 12.3, Plt 175 --Labs, 11/05/16: tProt ..., Alb 4.2, Ca 9.9, Cr 1.4, AP ...; SPEP -- MSpike 1.0g/dL; IgA 1559; kappa 43.1, lambda 21.0, KLR 2.05; WBC 4.9, Hgb 10.4, MCV 83.0, MCH 28.1, MCHC 34.0, RDW 12.9, Plt 202; Fe 50, FeSat 19%, TIBC 264, Ferritin 230, Vit D-25OH 23.5;  --Renal US, 11/12/16: Echogenic left renal parenchyma noted. --Labs, 01/21/17:                   WBC 4.0, Hgb 10.5, MCV 85.9, MCH 27.9, MCHC 32.5, RDW 11.6, Plt 222; TSH 2.04  Medical History: Past Medical History:  Diagnosis Date  . Acid reflux   . Allergic rhinitis   . Alzheimer disease   . Anxiety   . Aortic insufficiency     a. mild to moderate by echo 02/2015.  . Back pain   . CAD (coronary artery disease), native coronary artery    NSTEMI 10/2014 - medical management due to frailty.  . Chronic back pain   . Chronic combined systolic and diastolic CHF (congestive heart failure) (HCC)    a. EF 20-25% with severe global LV dysfunction 10/2014. b. repeat echo 02/2015 with EF 56-15%, grade 2 diastolic dysfunction, mild RV dysfunctoin with akinetic RV, severe biatrial enlargement.  . CKD (chronic kidney disease), stage III ([place])   . Cold intolerance   . Depression   . DNR (do not resuscitate)   . Essential hypertension   . GERD (gastroesophageal reflux disease)   . H. pylori infection   . Hyperlipidemia LDL goal <70 03/17/2016  . Mastodynia   . MI, old   . Mild anemia   . OA (osteoarthritis)   . Pain in gums   . Pulmonary hypertension ([place])    a. PASP 74mHg by echo 02/2015  . Seasonal allergies   . Tremor   . Urinary incontinence   . Vitamin D deficiency     Surgical History: Past Surgical History:  Procedure Laterality Date  . BREAST LUMPECTOMY    . CHOLECYSTECTOMY      Family History: Family History  Problem Relation Age of Onset  . Heart disease Mother   . Heart failure Mother   . Heart disease Father   . Heart attack Father     Social History: Social History   Socioeconomic History  . Marital status: Widowed    Spouse name: Not on file  . Number of children: Not on file  . Years of education: Not on file  . Highest education level: Not on file  Social Needs  . Financial resource strain: Not on file  . Food insecurity - worry: Not on file  . Food insecurity - inability: Not on file  . Transportation needs - medical: Not on file  . Transportation needs - non-medical: Not on file  Occupational History  . Not on file  Tobacco Use  . Smoking status: Never Smoker  . Smokeless tobacco: Never Used  Substance and Sexual Activity  . Alcohol use: No  . Drug use: No  . Sexual  activity: No  Other Topics Concern  . Not on file  Social History Narrative  . Not on file    Allergies: Allergies  Allergen Reactions  . Codeine Nausea And Vomiting  Medications:  Current Outpatient Medications  Medication Sig Dispense Refill  . acetaminophen (TYLENOL) 500 MG tablet Take 1 tablet (500 mg total) by mouth every 6 (six) hours as needed. 30 tablet 0  . aspirin 81 MG tablet Take 81 mg by mouth daily.    Marland Kitchen atorvastatin (LIPITOR) 20 MG tablet TAKE 1 TABLET (20 MG TOTAL) BY MOUTH DAILY. 90 tablet 3  . chlorhexidine gluconate, MEDLINE KIT, (PERIDEX) 0.12 % solution Use as directed 15 mLs in the mouth or throat 2 (two) times daily. 473 mL 1  . Cholecalciferol (VITAMIN D PO) Take by mouth. Vitamin D Hydroxy 25 daily    . clotrimazole (LOTRIMIN) 1 % cream Apply 1 application topically 2 (two) times daily. 40 g 1  . diclofenac sodium (VOLTAREN) 1 % GEL Apply 4 g topically 4 (four) times daily. Apply to affected painful area 100 g 3  . donepezil (ARICEPT) 10 MG tablet Take 1 tablet (10 mg total) by mouth at bedtime. 90 tablet 1  . furosemide (LASIX) 40 MG tablet Take 0.5 tablets (20 mg total) by mouth daily. 90 tablet 1  . gatifloxacin (ZYMAXID) 0.5 % SOLN Place 1 drop into the right eye 4 (four) times daily. 2 DAYS AFTER EYE INJECTIONS    . omeprazole (PRILOSEC) 40 MG capsule Take 1 capsule (40 mg total) by mouth daily. 90 capsule 3  . traZODone (DESYREL) 100 MG tablet Take 1 tablet (100 mg total) by mouth at bedtime. 90 tablet 3  . isosorbide mononitrate (IMDUR) 30 MG 24 hr tablet Take 1 tablet (30 mg total) by mouth daily. 90 tablet 3  . lisinopril (PRINIVIL,ZESTRIL) 2.5 MG tablet Take 1 tablet (2.5 mg total) by mouth daily. 90 tablet 3   No current facility-administered medications for this visit.     Review of Systems: Review of Systems  All other systems reviewed and are negative.    PHYSICAL EXAMINATION Blood pressure 132/62, pulse 76, temperature 98.6 F (37  C), temperature source Oral, resp. rate 18, height 5' 5"  (1.651 m), weight 154 lb 9.6 oz (70.1 kg), SpO2 100 %.  ECOG PERFORMANCE STATUS: 2 - Symptomatic, <50% confined to bed  Physical Exam  Constitutional: She is oriented to person, place, and time. No distress.  HENT:  Head: Normocephalic and atraumatic.  Mouth/Throat: Oropharynx is clear and moist. No oropharyngeal exudate.  Eyes: Conjunctivae and EOM are normal. Pupils are equal, round, and reactive to light. No scleral icterus.  Neck: No thyromegaly present.  Cardiovascular: Normal rate, regular rhythm and normal heart sounds.  No murmur heard. Pulmonary/Chest: Effort normal and breath sounds normal. No respiratory distress. She has no wheezes. She has no rales.  Abdominal: Soft. Bowel sounds are normal. She exhibits no distension and no mass. There is no tenderness. There is no guarding.  Musculoskeletal: She exhibits no edema.  Tenderness to palpation of the mid to lower one third of the right inner thigh.  No palpable mass or vascular cord.  Lymphadenopathy:    She has no cervical adenopathy.  Neurological: She is alert and oriented to person, place, and time. She has normal reflexes. No cranial nerve deficit.  Skin: Skin is warm and dry. No rash noted. She is not diaphoretic. No erythema. No pallor.     LABORATORY DATA: I have personally reviewed the data as listed: Appointment on 03/01/2017  Component Date Value Ref Range Status  . Kappa free light chain 03/01/2017 52.3* 3.3 - 19.4 mg/L Final  . Lamda free light chains  03/01/2017 15.0  5.7 - 26.3 mg/L Final  . Kappa, lamda light chain ratio 03/01/2017 3.49* 0.26 - 1.65 Final   Comment: (NOTE) Performed At: [facility] [place], [place] [id number] Rush Farmer MD WU:[id number] Performed at [facility], [place] [address] [place], [place] [address]   . IgG (Immunoglobin G), Serum 03/01/2017 1,036  700 - 1,600 mg/dL Final   . IgA 03/01/2017 1,493* 64 - 422 mg/dL Final   Comment: (NOTE) Results confirmed on dilution.   . IgM (Immunoglobulin M), Srm 03/01/2017 42  26 - 217 mg/dL Final  . Total Protein ELP 03/01/2017 7.1  6.0 - 8.5 g/dL Corrected  . Albumin SerPl Elph-Mcnc 03/01/2017 3.3  2.9 - 4.4 g/dL Corrected  . Alpha 1 03/01/2017 0.3  0.0 - 0.4 g/dL Corrected  . Alpha2 Glob SerPl Elph-Mcnc 03/01/2017 0.9  0.4 - 1.0 g/dL Corrected  . B-Globulin SerPl Elph-Mcnc 03/01/2017 0.8  0.7 - 1.3 g/dL Corrected  . Gamma Glob SerPl Elph-Mcnc 03/01/2017 1.8  0.4 - 1.8 g/dL Corrected  . M Protein SerPl Elph-Mcnc 03/01/2017 0.9* Not Observed g/dL Corrected   Comment: An additional m spike was observed at a concentration of 0.2 g/dl   . Globulin, Total 03/01/2017 3.8  2.2 - 3.9 g/dL Corrected  . Albumin/Glob SerPl 03/01/2017 0.9  0.7 - 1.7 Corrected  . IFE 1 03/01/2017 Comment   Corrected   Comment: (NOTE) Immunofixation shows IgA monoclonal protein with kappa light chain specificity.   . Please Note 03/01/2017 Comment   Corrected   Comment: (NOTE) Protein electrophoresis scan will follow via computer, mail, or courier delivery. Performed At: [facility] [place], [place] [id number] Rush Farmer MD TA:[id number] Performed at [facility], [place] [address] [place], [place] [address]   . Beta-2 Microglobulin 03/01/2017 2.8* 0.6 - 2.4 mg/L Final   Comment: (NOTE) Siemens Immulite 2000 Immunochemiluminometric assay (ICMA) Values obtained with different assay methods or kits cannot be used interchangeably. Results cannot be interpreted as absolute evidence of the presence or absence of malignant disease. Performed At: [facility] [place], [place] [id number] Rush Farmer MD QG:[id number] Performed at [facility], [place] [address], [place], [place] [address]   . LDH 03/01/2017 176  125 - 245 U/L Final   Performed  at [facility], [place] [address], [place],  [address]  . Sodium 03/01/2017 138  136 - 145 mmol/L Final  . Potassium 03/01/2017 4.4  3.5 - 5.1 mmol/L Final  . Chloride 03/01/2017 106  98 - 109 mmol/L Final  . CO2 03/01/2017 22  22 - 29 mmol/L Final  . Glucose, Bld 03/01/2017 108  70 - 140 mg/dL Final  . BUN 03/01/2017 9  7 - 26 mg/dL Final  . Creatinine 03/01/2017 1.30* 0.60 - 1.10 mg/dL Final  . Calcium 03/01/2017 9.0  8.4 - 10.4 mg/dL Final  . Total Protein 03/01/2017 7.1  6.4 - 8.3 g/dL Final  . Albumin 03/01/2017 3.1* 3.5 - 5.0 g/dL Final  . AST 03/01/2017 28  5 - 34 U/L Final  . ALT 03/01/2017 20  0 - 55 U/L Final  . Alkaline Phosphatase 03/01/2017 69  40 - 150 U/L Final  . Total Bilirubin 03/01/2017 0.3  0.2 - 1.2 mg/dL Final  . GFR, Est Non Af Am 03/01/2017 35* >60 mL/min Final  . GFR, Est AFR Am 03/01/2017 41* >60 mL/min Final   Comment: (NOTE)  The eGFR has been calculated using the CKD EPI equation. This calculation has not been validated in all clinical situations. eGFR's persistently <60 mL/min signify possible Chronic Kidney Disease.   Georgiann Hahn gap 03/01/2017 10  3 - 11 Final   Performed at [facility], [place] [address], [place], [place] [address]  . WBC Count 03/01/2017 3.2* 3.9 - 10.3 K/uL Final  . RBC 03/01/2017 3.58* 3.70 - 5.45 MIL/uL Final  . Hemoglobin 03/01/2017 10.0* 11.6 - 15.9 g/dL Final  . HCT 03/01/2017 30.4* 34.8 - 46.6 % Final  . MCV 03/01/2017 84.9  79.5 - 101.0 fL Final  . MCH 03/01/2017 27.9  25.1 - 34.0 pg Final  . MCHC 03/01/2017 32.8  31.5 - 36.0 g/dL Final  . RDW 03/01/2017 13.2  11.2 - 14.5 % Final  . Platelet Count 03/01/2017 161  145 - 400 K/uL Final  . Neutrophils Relative % 03/01/2017 54  % Final  . Neutro Abs 03/01/2017 1.7  1.5 - 6.5 K/uL Final  . Lymphocytes Relative 03/01/2017 34  % Final  . Lymphs Abs 03/01/2017 1.1  0.9 - 3.3 K/uL Final  . Monocytes Relative 03/01/2017 11  % Final   . Monocytes Absolute 03/01/2017 0.3  0.1 - 0.9 K/uL Final  . Eosinophils Relative 03/01/2017 0  % Final  . Eosinophils Absolute 03/01/2017 0.0  0.0 - 0.5 K/uL Final  . Basophils Relative 03/01/2017 1  % Final  . Basophils Absolute 03/01/2017 0.0  0.0 - 0.1 K/uL Final   Performed at [facility], [place] [address], [place], [place] [address]         Ardath Sax, MD

## 2017-03-21 NOTE — Assessment & Plan Note (Signed)
84 y.o. female with new discovery of monoclonal gammopathy of unknown significance.  The labs demonstrating the abnormality had been obtained in October.  At this time, I would like to obtain repeat lab work to confirm presence of the laboratory abnormality.  Patient has no significant hypercalcemia, she does have renal dysfunction, but no neuropathy and no significant anemia or thrombocytopenia.  Plan: -Labs today as outlined below. -Skeletal survey. -Return to clinic in 1 week to review the findings.

## 2017-03-27 NOTE — Progress Notes (Signed)
[facility] Follow-[facility] Visit:  Assessment: MGUS (monoclonal gammopathy of unknown significance) 84 y.o. 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  Repeat lab work obtained during the last visit to our clinic demonstrates relatively stable monoclonal gammopathy, but with somewhat progressive KLR.  Initial evaluation reveals no significant hypercalcemia, no progressive renal dysfunction, and no skeletal lesions by skeletal survey.  In short, no direct evidence for active multiple myeloma at this point in time.  Normal next step in the assessment would be undergoing bone marrow biopsy and possible PET/CT to confirm absence of active skeletal disease.  Patient is not interested in further invasive assessment and would like to continue monitoring of her condition conservatively.  She also prefers that to be conducted by her primary care provider to reduce the number of different doctors she has to see.  Plan: -Recommend repeat blood work by primary care provider including SPEP, quantitative immunoglobulins, and light chains every 6 months. -Return to our clinic as needed if patient becomes symptomatic with her underlying disorder and wishes to discuss possible management strategies.   Voice recognition software was used and creation of this note. Despite my best effort at editing the text, some misspelling/errors may have occurred.  Orders Placed This Encounter  Procedures  . IFE/Light Chains/TP Qn, 24-Hr Ur    Cancer Staging No matching staging information was found for the patient.  All questions were answered.  . The patient knows to call the clinic with any problems, questions or concerns.  This note was electronically signed.    History of Presenting Illness Madeline Atkinson Is a 84 y.o. female followed in the [facility] for evaluation of monoclonal gammopathy of unknown significance, referred by  Dr. Elmarie Shiley.  Monoclonal gammopathy was found during evaluation for renal dysfunction.  Since the discovery, patient denies having any new symptoms.  Energy level and appetite remain unchanged.  Patient denies any new musculoskeletal pain other than discomfort in the lower right thigh.  Denies numbness, tingling, loss of sensation in hands or feet.  Denies constipation, diarrhea, or urinary retention.  Oncological/hematological History: --Labs, 06/23/15:                                                                                                                                                                                    WBC 4.6, Hgb 11.7, MCV 82.1, MCH 27.1, MCHC 33.1, RDW 14.6, Plt 149 --Labs, 10/11/16:  WBC 5.6, Hgb 10.0, MCV 86.3, MCH 28.6, MCHC 33.1, RDW 12.3, Plt 175 --Labs, 11/05/16: tProt   ..., Alb 4.2, Ca 9.9, Cr 1.4, AP  ...; SPEP -- M-Spike 1.0g/dL; IgA 1559; kappa 43.1, lambda 21.0, KLR 2.05; WBC 4.9, Hgb 10.4, MCV 83.0, MCH 28.1, MCHC 34.0, RDW 12.9, Plt 202; Fe 50, FeSat 19%, TIBC 264, Ferritin 230, Vit D-25OH 23.5;  --Renal US, 11/12/16: Echogenic left renal parenchyma noted. --Labs, 01/21/17:                                                                                                                                                                                    WBC 4.0, Hgb 10.5, MCV 85.9, MCH 27.9, MCHC 32.5, RDW 11.6, Plt 222; TSH 2.04 --Labs, 03/01/17: tProt 7.1, Alb 3.1, Ca 9.0, Cr 1.3, AP 69; SPEP -- M-Spike #1, 0.9g/dL (IgA kappa), M-Spike #1 0.2g/dL; IgG 1036, IgA 1493, IgM 42; kappa 52.3, lambda 15.0, KLR 3.49; WBC 3.2, Hgb 10.0, Plt 161;  --Skeletal Survey, 03/01/17: No lytic lesions  No history exists.    Medical History: Past Medical History:  Diagnosis Date  . Acid reflux   .  Allergic rhinitis   . Alzheimer disease   . Anxiety   . Aortic insufficiency    a. mild to moderate by echo 02/2015.  . Back pain   . CAD (coronary artery disease), native coronary artery    NSTEMI 10/2014 - medical management due to frailty.  . Chronic back pain   . Chronic combined systolic and diastolic CHF (congestive heart failure) (HCC)    a. EF 20-25% with severe global LV dysfunction 10/2014. b. repeat echo 02/2015 with EF 54-98%, grade 2 diastolic dysfunction, mild RV dysfunctoin with akinetic RV, severe biatrial enlargement.  . CKD (chronic kidney disease), stage III ([place])   . Cold intolerance   . Depression   . DNR (do not resuscitate)   . Essential hypertension   . GERD (gastroesophageal reflux disease)   . H. pylori infection   . Hyperlipidemia LDL goal <70 03/17/2016  . Mastodynia   . MI, old   . Mild anemia   . OA (osteoarthritis)   . Pain in gums   . Pulmonary hypertension ([place])    a. PASP 8mHg by echo 02/2015  . Seasonal allergies   . Tremor   . Urinary incontinence   . Vitamin D deficiency     Surgical History: Past Surgical History:  Procedure Laterality Date  . BREAST LUMPECTOMY    . CHOLECYSTECTOMY      Family History: Family History  Problem Relation Age of Onset  . Heart disease Mother   . Heart failure Mother   .  Heart disease Father   . Heart attack Father     Social History: Social History   Socioeconomic History  . Marital status: Widowed    Spouse name: Not on file  . Number of children: Not on file  . Years of education: Not on file  . Highest education level: Not on file  Social Needs  . Financial resource strain: Not on file  . Food insecurity - worry: Not on file  . Food insecurity - inability: Not on file  . Transportation needs - medical: Not on file  . Transportation needs - non-medical: Not on file  Occupational History  . Not on file  Tobacco Use  . Smoking status: Never Smoker  . Smokeless tobacco: Never Used   Substance and Sexual Activity  . Alcohol use: No  . Drug use: No  . Sexual activity: No  Other Topics Concern  . Not on file  Social History Narrative  . Not on file    Allergies: Allergies  Allergen Reactions  . Codeine Nausea And Vomiting    Medications:  Current Outpatient Medications  Medication Sig Dispense Refill  . acetaminophen (TYLENOL) 500 MG tablet Take 1 tablet (500 mg total) by mouth every 6 (six) hours as needed. 30 tablet 0  . aspirin 81 MG tablet Take 81 mg by mouth daily.    Marland Kitchen atorvastatin (LIPITOR) 20 MG tablet TAKE 1 TABLET (20 MG TOTAL) BY MOUTH DAILY. 90 tablet 3  . chlorhexidine gluconate, MEDLINE KIT, (PERIDEX) 0.12 % solution Use as directed 15 mLs in the mouth or throat 2 (two) times daily. 473 mL 1  . Cholecalciferol (VITAMIN D PO) Take by mouth. Vitamin D Hydroxy 25 daily    . clotrimazole (LOTRIMIN) 1 % cream Apply 1 application topically 2 (two) times daily. 40 g 1  . diclofenac sodium (VOLTAREN) 1 % GEL Apply 4 g topically 4 (four) times daily. Apply to affected painful area 100 g 3  . donepezil (ARICEPT) 10 MG tablet Take 1 tablet (10 mg total) by mouth at bedtime. 90 tablet 1  . furosemide (LASIX) 40 MG tablet Take 0.5 tablets (20 mg total) by mouth daily. 90 tablet 1  . gatifloxacin (ZYMAXID) 0.5 % SOLN Place 1 drop into the right eye 4 (four) times daily. 2 DAYS AFTER EYE INJECTIONS    . isosorbide mononitrate (IMDUR) 30 MG 24 hr tablet Take 1 tablet (30 mg total) by mouth daily. 90 tablet 3  . lisinopril (PRINIVIL,ZESTRIL) 2.5 MG tablet Take 1 tablet (2.5 mg total) by mouth daily. 90 tablet 3  . omeprazole (PRILOSEC) 40 MG capsule Take 1 capsule (40 mg total) by mouth daily. 90 capsule 3  . traZODone (DESYREL) 100 MG tablet Take 1 tablet (100 mg total) by mouth at bedtime. 90 tablet 3   No current facility-administered medications for this visit.     Review of Systems: Review of Systems  All other systems reviewed and are  negative.    PHYSICAL EXAMINATION Blood pressure 135/60, pulse 75, temperature 98.3 F (36.8 C), temperature source Oral, resp. rate 16, height _0  (1.651 m), weight 154 lb 6.4 oz (70 kg), SpO2 99 %.  ECOG PERFORMANCE STATUS: 2 - Symptomatic, <50% confined to bed  Physical Exam  Constitutional: She is oriented to person, place, and time. No distress.  HENT:  Head: Normocephalic and atraumatic.  Mouth/Throat: Oropharynx is clear and moist. No oropharyngeal exudate.  Eyes: Conjunctivae and EOM are normal. Pupils are equal, round, and reactive  to light. No scleral icterus.  Neck: No thyromegaly present.  Cardiovascular: Normal rate, regular rhythm and normal heart sounds.  No murmur heard. Pulmonary/Chest: Effort normal and breath sounds normal. No respiratory distress. She has no wheezes. She has no rales.  Abdominal: Soft. Bowel sounds are normal. She exhibits no distension and no mass. There is no tenderness. There is no guarding.  Musculoskeletal: She exhibits no edema.  Tenderness to palpation of the mid to lower one third of the right inner thigh.  No palpable mass or vascular cord.  Lymphadenopathy:    She has no cervical adenopathy.  Neurological: She is alert and oriented to person, place, and time. She has normal reflexes. No cranial nerve deficit.  Skin: Skin is warm and dry. No rash noted. She is not diaphoretic. No erythema. No pallor.     LABORATORY DATA: I have personally reviewed the data as listed: Office Visit on 03/07/2017  Component Date Value Ref Range Status  . Total Protein, Urine 03/07/2017 4.1  Not Estab. mg/dL Final  . Total Protein, Urine-Ur/day 03/07/2017 47  30 - 150 mg/24 hr Final  . Albumin, U 03/07/2017 100.0  % Final  . ALPHA 1 URINE 03/07/2017 0.0  % Final  . Alpha 2, Urine 03/07/2017 0.0  % Final  . % BETA, Urine 03/07/2017 0.0  % Final  . GAMMA GLOBULIN URINE 03/07/2017 0.0  % Final  . Free Kappa Lt Chains,Ur 03/07/2017 4.66  1.35 - 24.19  mg/L Final   **Results verified by repeat testing**  . Free Lambda Lt Chains,Ur 03/07/2017 0.30  0.24 - 6.66 mg/L Corrected   **Results verified by repeat testing**  . Free Kappa/Lambda Ratio 03/07/2017 15.53* 2.04 - 10.37 Corrected   Comment: (NOTE) Performed At: [facility] [place], [place] [id number] Rush Farmer MD IR:[id number]   . Immunofixation Result, Urine 03/07/2017 Comment   Corrected   An apparent normal immunofixation pattern.  . Total Volume 03/07/2017 1,150   Final  . M-SPIKE %, Urine 03/07/2017 Not Observed  Not Observed % Corrected  . Note: 03/07/2017 Comment   Corrected   Comment: (NOTE) Protein electrophoresis scan will follow via computer, mail, or courier delivery. Performed at [facility], [place] [address] [place], [place] [address]   Office Visit on 03/03/2017  Component Date Value Ref Range Status  . Color, UA 03/03/2017 light yellow* yellow Final  . Clarity, UA 03/03/2017 turbid* clear Final  . Glucose, UA 03/03/2017 negative  negative mg/dL Final  . Bilirubin, UA 03/03/2017 negative  negative Final  . Negative 03/03/2017 Negative   Final  . Spec Grav, UA 03/03/2017 1.015  1.010 - 1.025 Final  . Blood, UA 03/03/2017 large* negative Final  . pH, UA 03/03/2017 7.0  5.0 - 8.0 Final  . Protein Ur, POC 03/03/2017 =100* negative mg/dL Final  . Urobilinogen, UA 03/03/2017 0.2  0.2 or 1.0 E.U./dL Final  . Nitrite, UA 03/03/2017 Positive* Negative Final  . Leukocytes, UA 03/03/2017 Large (3+)* Negative Final  . MICRO NUMBER: 03/03/2017 [id number]   Final  . SPECIMEN QUALITY: 03/03/2017 ADEQUATE   Final  . Sample Source 03/03/2017 NOT GIVEN   Final  . STATUS: 03/03/2017 FINAL   Final  . ISOLATE 1: 03/03/2017 Escherichia coli*  Final  Appointment on 03/01/2017  Component Date Value Ref Range Status  . Kappa free light chain 03/01/2017 52.3* 3.3 - 19.4 mg/L Final  . Lamda free light chains 03/01/2017 15.0  5.7 -  26.3  mg/L Final  . Kappa, lamda light chain ratio 03/01/2017 3.49* 0.26 - 1.65 Final   Comment: (NOTE) Performed At: [facility] [place], [place] [id number] Rush Farmer MD [id number] Performed at [facility], [place] [address] [place], [place] [address]   . IgG (Immunoglobin G), Serum 03/01/2017 1,036  700 - 1,600 mg/dL Final  . IgA 03/01/2017 1,493* 64 - 422 mg/dL Final   Comment: (NOTE) Results confirmed on dilution.   . IgM (Immunoglobulin M), Srm 03/01/2017 42  26 - 217 mg/dL Final  . Total Protein ELP 03/01/2017 7.1  6.0 - 8.5 g/dL Corrected  . Albumin SerPl Elph-Mcnc 03/01/2017 3.3  2.9 - 4.4 g/dL Corrected  . Alpha 1 03/01/2017 0.3  0.0 - 0.4 g/dL Corrected  . Alpha2 Glob SerPl Elph-Mcnc 03/01/2017 0.9  0.4 - 1.0 g/dL Corrected  . B-Globulin SerPl Elph-Mcnc 03/01/2017 0.8  0.7 - 1.3 g/dL Corrected  . Gamma Glob SerPl Elph-Mcnc 03/01/2017 1.8  0.4 - 1.8 g/dL Corrected  . M Protein SerPl Elph-Mcnc 03/01/2017 0.9* Not Observed g/dL Corrected   Comment: An additional m spike was observed at a concentration of 0.2 g/dl   . Globulin, Total 03/01/2017 3.8  2.2 - 3.9 g/dL Corrected  . Albumin/Glob SerPl 03/01/2017 0.9  0.7 - 1.7 Corrected  . IFE 1 03/01/2017 Comment   Corrected   Comment: (NOTE) Immunofixation shows IgA monoclonal protein with kappa light chain specificity.   . Please Note 03/01/2017 Comment   Corrected   Comment: (NOTE) Protein electrophoresis scan will follow via computer, mail, or courier delivery. Performed At: [facility] [place], [place] [id number] Rush Farmer MD [id number] Performed at [facility], [place] [address] [place], [place] [address]   . Beta-2 Microglobulin 03/01/2017 2.8* 0.6 - 2.4 mg/L Final   Comment: (NOTE) Siemens Immulite 2000 Immunochemiluminometric assay (ICMA) Values obtained with different assay methods or kits cannot be  used interchangeably. Results cannot be interpreted as absolute evidence of the presence or absence of malignant disease. Performed At: [facility] [place], [place] [id number] Rush Farmer MD EH:[id number] Performed at [facility], [place] [address] [place], [place] [address]   . LDH 03/01/2017 176  125 - 245 U/L Final   Performed at [facility], [place]  [address], [place], [place] [address]  . Sodium 03/01/2017 138  136 - 145 mmol/L Final  . Potassium 03/01/2017 4.4  3.5 - 5.1 mmol/L Final  . Chloride 03/01/2017 106  98 - 109 mmol/L Final  . CO2 03/01/2017 22  22 - 29 mmol/L Final  . Glucose, Bld 03/01/2017 108  70 - 140 mg/dL Final  . BUN 03/01/2017 9  7 - 26 mg/dL Final  . Creatinine 03/01/2017 1.30* 0.60 - 1.10 mg/dL Final  . Calcium 03/01/2017 9.0  8.4 - 10.4 mg/dL Final  . Total Protein 03/01/2017 7.1  6.4 - 8.3 g/dL Final  . Albumin 03/01/2017 3.1* 3.5 - 5.0 g/dL Final  . AST 03/01/2017 28  5 - 34 U/L Final  . ALT 03/01/2017 20  0 - 55 U/L Final  . Alkaline Phosphatase 03/01/2017 69  40 - 150 U/L Final  . Total Bilirubin 03/01/2017 0.3  0.2 - 1.2 mg/dL Final  . GFR, Est Non Af Am 03/01/2017 35* >60 mL/min Final  . GFR, Est AFR Am 03/01/2017 41* >60 mL/min Final   Comment: (NOTE) The eGFR has been calculated using the  CKD EPI equation. This calculation has not been validated in all clinical situations. eGFR's persistently <60 mL/min signify possible Chronic Kidney Disease.   Georgiann Hahn gap 03/01/2017 10  3 - 11 Final   Performed at [facility], [place] [address] [place], [place] [address]  . WBC Count 03/01/2017 3.2* 3.9 - 10.3 K/uL Final  . RBC 03/01/2017 3.58* 3.70 - 5.45 MIL/uL Final  . Hemoglobin 03/01/2017 10.0* 11.6 - 15.9 g/dL Final  . HCT 03/01/2017 30.4* 34.8 - 46.6 % Final  . MCV 03/01/2017 84.9  79.5 - 101.0 fL Final  . MCH 03/01/2017 27.9  25.1 - 34.0 pg Final  .  MCHC 03/01/2017 32.8  31.5 - 36.0 g/dL Final  . RDW 03/01/2017 13.2  11.2 - 14.5 % Final  . Platelet Count 03/01/2017 161  145 - 400 K/uL Final  . Neutrophils Relative % 03/01/2017 54  % Final  . Neutro Abs 03/01/2017 1.7  1.5 - 6.5 K/uL Final  . Lymphocytes Relative 03/01/2017 34  % Final  . Lymphs Abs 03/01/2017 1.1  0.9 - 3.3 K/uL Final  . Monocytes Relative 03/01/2017 11  % Final  . Monocytes Absolute 03/01/2017 0.3  0.1 - 0.9 K/uL Final  . Eosinophils Relative 03/01/2017 0  % Final  . Eosinophils Absolute 03/01/2017 0.0  0.0 - 0.5 K/uL Final  . Basophils Relative 03/01/2017 1  % Final  . Basophils Absolute 03/01/2017 0.0  0.0 - 0.1 K/uL Final   Performed at [facility], [place] [address], [place], [place] [address]       Ardath Sax, MD

## 2017-03-27 NOTE — Assessment & Plan Note (Signed)
84 y.o. 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  Repeat lab work obtained during the last visit to our clinic demonstrates relatively stable monoclonal gammopathy, but with somewhat progressive KLR.  Initial evaluation reveals no significant hypercalcemia, no progressive renal dysfunction, and no skeletal lesions by skeletal survey.  In short, no direct evidence for active multiple myeloma at this point in time.  Normal next step in the assessment would be undergoing bone marrow biopsy and possible PET/CT to confirm absence of active skeletal disease.  Patient is not interested in further invasive assessment and would like to continue monitoring of her condition conservatively.  She also prefers that to be conducted by her primary care provider to reduce the number of different doctors she has to see.  Plan: -Recommend repeat blood work by primary care provider including SPEP, quantitative immunoglobulins, and light chains every 6 months. -Return to our clinic as needed if patient becomes symptomatic with her underlying disorder and wishes to discuss possible management strategies.

## 2017-04-13 ENCOUNTER — Other Ambulatory Visit: Payer: Self-pay | Admitting: Osteopathic Medicine

## 2017-04-13 DIAGNOSIS — K219 Gastro-esophageal reflux disease without esophagitis: Secondary | ICD-10-CM

## 2017-05-04 ENCOUNTER — Ambulatory Visit: Payer: Medicare HMO | Admitting: Cardiology

## 2017-05-04 ENCOUNTER — Encounter: Payer: Self-pay | Admitting: Cardiology

## 2017-05-04 VITALS — BP 126/56 | HR 56 | Ht 65.0 in | Wt 156.0 lb

## 2017-05-04 DIAGNOSIS — I5042 Chronic combined systolic (congestive) and diastolic (congestive) heart failure: Secondary | ICD-10-CM

## 2017-05-04 DIAGNOSIS — E785 Hyperlipidemia, unspecified: Secondary | ICD-10-CM

## 2017-05-04 DIAGNOSIS — I2510 Atherosclerotic heart disease of native coronary artery without angina pectoris: Secondary | ICD-10-CM | POA: Diagnosis not present

## 2017-05-04 DIAGNOSIS — I10 Essential (primary) hypertension: Secondary | ICD-10-CM

## 2017-05-04 DIAGNOSIS — I272 Pulmonary hypertension, unspecified: Secondary | ICD-10-CM

## 2017-05-04 DIAGNOSIS — I351 Nonrheumatic aortic (valve) insufficiency: Secondary | ICD-10-CM

## 2017-05-04 NOTE — Patient Instructions (Signed)
Medication Instructions:  Your physician recommends that you continue on your current medications as directed. Please refer to the Current Medication list given to you today.  If you need a refill on your cardiac medications, please contact your pharmacy first.  Labwork: None ordered   Testing/Procedures: None ordered   Follow-Up: Your physician wants you to follow-up in: 6 months with Dr. Turner. You will receive a reminder letter in the mail two months in advance. If you don't receive a letter, please call our office to schedule the follow-up appointment.  Any Other Special Instructions Will Be Listed Below (If Applicable).   Thank you for choosing [facility]    Rena Thompson Mckim, RN  [phone]  If you need a refill on your cardiac medications before your next appointment, please call your pharmacy.   

## 2017-05-04 NOTE — Progress Notes (Signed)
Cardiology Office Note:    Date:  05/04/2017   ID:  Madeline Atkinson, DOB [birth year], MRN [id number]  PCP:  Emeterio Reeve, DO  Cardiologist:  No primary care provider on file.    Referring MD: Emeterio Reeve, DO   Chief Complaint  Patient presents with  . Coronary Artery Disease  . Hypertension  . Hyperlipidemia    History of Present Illness:    Madeline Atkinson is a 84 y.o. female with a hx of GERD, dementia, chronic combined systolic/diastolic CHF (EF 63-01%), mild-moderate AI, severe pulm HTN, CAD (remote MI, then NSTEMI 10/2014 managed medically),  CKD stage III by labs, mild anemia, hyperlipidemia who presents for 6 month follow-up. She had MI years ago and then again in October of 2016 with no cath due to advanced age. Prior anginal equivalent was upper abdominal pain. 2D echo 02/2015: EF 10-15%, severe global diffuse HK, extensive anterior, anteroseptal, septal and apical akinesis, grade 2 DD, mildly reduced RV function, akinesis of RV apex, severely dilated LA/RA, mild MR, mild-mod AI, mild TR, severe pulm HTN with PASP 92mHg. She is here today for followup and is doing well from a cardiology standpoint.  She denies any chest pain or pressure, SOB, DOE, PND, orthopnea, LE edema, dizziness, palpitations or syncope. She is compliant with her meds and is tolerating meds with no SE.      Past Medical History:  Diagnosis Date  . Acid reflux   . Allergic rhinitis   . Alzheimer disease   . Anxiety   . Aortic insufficiency    a. mild to moderate by echo 02/2015.  . Back pain   . CAD (coronary artery disease), native coronary artery    NSTEMI 10/2014 - medical management due to frailty.  . Chronic back pain   . Chronic combined systolic and diastolic CHF (congestive heart failure) (HCC)    a. EF 20-25% with severe global LV dysfunction 10/2014. b. repeat echo 02/2015 with EF 160-10% grade 2 diastolic dysfunction, mild RV dysfunctoin with akinetic RV, severe biatrial enlargement.  . CKD  (chronic kidney disease), stage III ([facility]   . Cold intolerance   . Depression   . DNR (do not resuscitate)   . Essential hypertension   . GERD (gastroesophageal reflux disease)   . H. pylori infection   . Hyperlipidemia LDL goal <70 03/17/2016  . Mastodynia   . MI, old   . Mild anemia   . OA (osteoarthritis)   . Pain in gums   . Pulmonary hypertension ([place]    a. PASP 657mg by echo 02/2015  . Seasonal allergies   . Tremor   . Urinary incontinence   . Vitamin D deficiency     Past Surgical History:  Procedure Laterality Date  . BREAST LUMPECTOMY    . CHOLECYSTECTOMY      Current Medications: Current Meds  Medication Sig  . acetaminophen (TYLENOL) 500 MG tablet Take 1 tablet (500 mg total) by mouth every 6 (six) hours as needed.  . Marland Kitchenspirin 81 MG tablet Take 81 mg by mouth daily.  . Marland Kitchentorvastatin (LIPITOR) 20 MG tablet TAKE 1 TABLET (20 MG TOTAL) BY MOUTH DAILY.  . chlorhexidine gluconate, MEDLINE KIT, (PERIDEX) 0.12 % solution Use as directed 15 mLs in the mouth or throat 2 (two) times daily.  . Cholecalciferol (VITAMIN D PO) Take by mouth. Vitamin D Hydroxy 25 daily  . clotrimazole (LOTRIMIN) 1 % cream Apply 1 application topically 2 (two) times daily.  . diclofenac sodium (VOLTAREN) 1 %  GEL Apply 4 g topically 4 (four) times daily. Apply to affected painful area  . donepezil (ARICEPT) 10 MG tablet Take 1 tablet (10 mg total) by mouth at bedtime.  . furosemide (LASIX) 40 MG tablet Take 0.5 tablets (20 mg total) by mouth daily.  Marland Kitchen gatifloxacin (ZYMAXID) 0.5 % SOLN Place 1 drop into the right eye 4 (four) times daily. 2 DAYS AFTER EYE INJECTIONS  . isosorbide mononitrate (IMDUR) 30 MG 24 hr tablet Take 1 tablet (30 mg total) by mouth daily.  Marland Kitchen lisinopril (PRINIVIL,ZESTRIL) 2.5 MG tablet Take 1 tablet (2.5 mg total) by mouth daily.  Marland Kitchen omeprazole (PRILOSEC) 40 MG capsule TAKE 1 CAPSULE (40 MG TOTAL) BY MOUTH DAILY.  . traZODone (DESYREL) 100 MG tablet Take 1 tablet (100 mg total)  by mouth at bedtime.     Allergies:   Codeine   Social History   Socioeconomic History  . Marital status: Widowed    Spouse name: Not on file  . Number of children: Not on file  . Years of education: Not on file  . Highest education level: Not on file  Occupational History  . Not on file  Social Needs  . Financial resource strain: Not on file  . Food insecurity:    Worry: Not on file    Inability: Not on file  . Transportation needs:    Medical: Not on file    Non-medical: Not on file  Tobacco Use  . Smoking status: Never Smoker  . Smokeless tobacco: Never Used  Substance and Sexual Activity  . Alcohol use: No  . Drug use: No  . Sexual activity: Never  Lifestyle  . Physical activity:    Days per week: Not on file    Minutes per session: Not on file  . Stress: Not on file  Relationships  . Social connections:    Talks on phone: Not on file    Gets together: Not on file    Attends religious service: Not on file    Active member of club or organization: Not on file    Attends meetings of clubs or organizations: Not on file    Relationship status: Not on file  Other Topics Concern  . Not on file  Social History Narrative  . Not on file     Family History: The patient's family history includes Heart attack in her father; Heart disease in her father and mother; Heart failure in her mother.  ROS:   Please see the history of present illness.    ROS  All other systems reviewed and negative.   EKGs/Labs/Other Studies Reviewed:    The following studies were reviewed today: none  EKG:  EKG is not ordered today.    Recent Labs: 01/21/2017: Hemoglobin 10.5; TSH 2.04 03/01/2017: ALT 20; BUN 9; Creatinine 1.30; Platelet Count 161; Potassium 4.4; Sodium 138   Recent Lipid Panel    Component Value Date/Time   CHOL 130 10/07/2016 1010   TRIG 79 10/07/2016 1010   HDL 47 10/07/2016 1010   CHOLHDL 2.8 10/07/2016 1010   LDLCALC 67 10/07/2016 1010    Physical Exam:     VS:  BP (!) 126/56   Pulse (!) 56   Ht 5' 5"  (1.651 m)   Wt 156 lb (70.8 kg)   SpO2 93%   BMI 25.96 kg/m     Wt Readings from Last 3 Encounters:  05/04/17 156 lb (70.8 kg)  03/07/17 154 lb 6.4 oz (70 kg)  03/03/17  152 lb (68.9 kg)     GEN:  Well nourished, well developed in no acute distress HEENT: Normal NECK: No JVD; No carotid bruits LYMPHATICS: No lymphadenopathy CARDIAC: RRR, no murmurs, rubs, gallops RESPIRATORY:  Clear to auscultation without rales, wheezing or rhonchi  ABDOMEN: Soft, non-tender, non-distended MUSCULOSKELETAL:  No edema; No deformity  SKIN: Warm and dry NEUROLOGIC:  Alert and oriented x 3 PSYCHIATRIC:  Normal affect   ASSESSMENT:    1. Coronary artery disease involving native coronary artery of native heart without angina pectoris   2. Chronic combined systolic and diastolic heart failure ([place])   3. Benign essential HTN   4. Nonrheumatic aortic valve insufficiency   5. Pulmonary hypertension ([place])   6. Hyperlipidemia LDL goal <70    PLAN:    In order of problems listed above:  1.  ASCAD - she is s/p remote MI in the past and then non-ST elevation MI in 2016.  Medical management was recommended given patient's dementia and advanced age.  She has not had any anginal symptoms recently.  Continue on aspirin 81 mg daily and Lipitor 20 mg daily as well as Imdur 30 mg daily.  2.  Chronic combined systolic/diastolic CHF - she appears euvolemic on exam today her weight is stable.  She will continue on lisinopril 2.5 mg daily, Imdur 30 mg daily and Lasix 20 mg daily.  Her creatinine is stable at 1.3 on 03/01/2017.  She was taken off her carvedilol due to bradycardia.  3.  HTN -blood pressure is well controlled on exam today.  She will continue on lisinopril 2.5 mg daily.  4.  Mild to moderate aortic insufficiency - she is asymptomatic on exam.  I will not repeat a 2D echocardiogram she is not a surgical candidate due to advanced age and dementia.  5.   Severe pulmonary hypertension -she is asymptomatic at this time.  Continue diuretic therapy.  6.  Hyperlipidemia with LDL goal less than 70.  She will continue on Lipitor 20 mg daily.  Her LDL is at goal at 67 on 10/07/2016.   Medication Adjustments/Labs and Tests Ordered: Current medicines are reviewed at length with the patient today.  Concerns regarding medicines are outlined above.  No orders of the defined types were placed in this encounter.  No orders of the defined types were placed in this encounter.   Signed, Fransico Him, MD  05/04/2017 10:17 AM    [place]

## 2017-06-06 ENCOUNTER — Ambulatory Visit (INDEPENDENT_AMBULATORY_CARE_PROVIDER_SITE_OTHER): Payer: Medicare HMO | Admitting: Osteopathic Medicine

## 2017-06-06 ENCOUNTER — Ambulatory Visit (INDEPENDENT_AMBULATORY_CARE_PROVIDER_SITE_OTHER): Payer: Medicare HMO

## 2017-06-06 VITALS — BP 131/64 | HR 80 | Temp 98.5°F | Wt 152.6 lb

## 2017-06-06 DIAGNOSIS — N184 Chronic kidney disease, stage 4 (severe): Secondary | ICD-10-CM | POA: Diagnosis not present

## 2017-06-06 DIAGNOSIS — I2510 Atherosclerotic heart disease of native coronary artery without angina pectoris: Secondary | ICD-10-CM

## 2017-06-06 DIAGNOSIS — F0390 Unspecified dementia without behavioral disturbance: Secondary | ICD-10-CM

## 2017-06-06 DIAGNOSIS — D472 Monoclonal gammopathy: Secondary | ICD-10-CM

## 2017-06-06 DIAGNOSIS — M545 Low back pain, unspecified: Secondary | ICD-10-CM

## 2017-06-06 NOTE — Progress Notes (Signed)
HPI: Madeline Atkinson is a 84 y.o. female  who presents to [facility] today, 06/06/17,  for Medicare Annual Wellness Exam  Patient presents for annual physical/Medicare wellness exam. Other complaints today: lower back pain on R, no injury, no fall, no weakness/numbness lower legs.  Needs form filled out for adult day care   Past medical, surgical, social and family history reviewed:  Patient Active Problem List   Diagnosis Date Noted  . MGUS (monoclonal gammopathy of unknown significance) 03/21/2017  . CKD (chronic kidney disease) stage 4, GFR 15-29 ml/min (HCC) 11/18/2016  . Allergic rhinitis   . Anxiety   . Chronic back pain   . Alzheimer disease   . Depression   . Tremor   . Benign essential HTN   . Mastodynia   . OA (osteoarthritis)   . Urinary incontinence   . H. pylori infection   . Pulmonary hypertension ([place])   . Aortic insufficiency   . DNR (do not resuscitate)   . Chronic combined systolic and diastolic heart failure ([place]) 03/17/2016  . Hyperlipidemia LDL goal <70 03/17/2016  . History of MI (myocardial infarction) 02/19/2016  . Coronary artery disease involving native coronary artery of native heart without angina pectoris 02/19/2016  . Dementia 02/19/2016  . GERD (gastroesophageal reflux disease) 02/19/2016  . Back pain 02/19/2016  . Cold intolerance 02/19/2016    Past Surgical History:  Procedure Laterality Date  . BREAST LUMPECTOMY    . CHOLECYSTECTOMY      Social History   Socioeconomic History  . Marital status: Widowed    Spouse name: Not on file  . Number of children: Not on file  . Years of education: Not on file  . Highest education level: Not on file  Occupational History  . Not on file  Social Needs  . Financial resource strain: Not on file  . Food insecurity:    Worry: Not on file    Inability: Not on file  . Transportation needs:    Medical: Not on file    Non-medical: Not on file  Tobacco Use  .  Smoking status: Never Smoker  . Smokeless tobacco: Never Used  Substance and Sexual Activity  . Alcohol use: No  . Drug use: No  . Sexual activity: Never  Lifestyle  . Physical activity:    Days per week: Not on file    Minutes per session: Not on file  . Stress: Not on file  Relationships  . Social connections:    Talks on phone: Not on file    Gets together: Not on file    Attends religious service: Not on file    Active member of club or organization: Not on file    Attends meetings of clubs or organizations: Not on file    Relationship status: Not on file  . Intimate partner violence:    Fear of current or ex partner: Not on file    Emotionally abused: Not on file    Physically abused: Not on file    Forced sexual activity: Not on file  Other Topics Concern  . Not on file  Social History Narrative  . Not on file    Family History  Problem Relation Age of Onset  . Heart disease Mother   . Heart failure Mother   . Heart disease Father   . Heart attack Father      Current medication list and allergy/intolerance information reviewed:    Outpatient Encounter Medications  as of 06/06/2017  Medication Sig  . acetaminophen (TYLENOL) 500 MG tablet Take 1 tablet (500 mg total) by mouth every 6 (six) hours as needed.  Marland Kitchen aspirin 81 MG tablet Take 81 mg by mouth daily.  Marland Kitchen atorvastatin (LIPITOR) 20 MG tablet TAKE 1 TABLET (20 MG TOTAL) BY MOUTH DAILY.  . chlorhexidine gluconate, MEDLINE KIT, (PERIDEX) 0.12 % solution Use as directed 15 mLs in the mouth or throat 2 (two) times daily.  . Cholecalciferol (VITAMIN D PO) Take by mouth. Vitamin D Hydroxy 25 daily  . clotrimazole (LOTRIMIN) 1 % cream Apply 1 application topically 2 (two) times daily.  . diclofenac sodium (VOLTAREN) 1 % GEL Apply 4 g topically 4 (four) times daily. Apply to affected painful area  . donepezil (ARICEPT) 10 MG tablet Take 1 tablet (10 mg total) by mouth at bedtime.  . furosemide (LASIX) 40 MG tablet Take  0.5 tablets (20 mg total) by mouth daily.  Marland Kitchen gatifloxacin (ZYMAXID) 0.5 % SOLN Place 1 drop into the right eye 4 (four) times daily. 2 DAYS AFTER EYE INJECTIONS  . isosorbide mononitrate (IMDUR) 30 MG 24 hr tablet Take 1 tablet (30 mg total) by mouth daily.  Marland Kitchen lisinopril (PRINIVIL,ZESTRIL) 2.5 MG tablet Take 1 tablet (2.5 mg total) by mouth daily.  Marland Kitchen omeprazole (PRILOSEC) 40 MG capsule TAKE 1 CAPSULE (40 MG TOTAL) BY MOUTH DAILY.  . traZODone (DESYREL) 100 MG tablet Take 1 tablet (100 mg total) by mouth at bedtime.   No facility-administered encounter medications on file as of 06/06/2017.     Allergies  Allergen Reactions  . Codeine Nausea And Vomiting       Review of Systems: Review of Systems - Negative except MSK (+) LBP as per HPI    Medicare Wellness Questionnaire  Are there smokers in your home (other than you)? no  Depression Screen (Note: if answer to either of the following is "Yes", a more complete depression screening is indicated)   Q1: Over the past two weeks, have you felt down, depressed or hopeless? no  Q2: Over the past two weeks, have you felt little interest or pleasure in doing things? no  Have you lost interest or pleasure in daily life? no  Do you often feel hopeless? no  Do you cry easily over simple problems? yes  Activities of Daily Living In your present state of health, do you have any difficulty performing the following activities?:  Driving? yes Managing money?  yes Feeding yourself? no Getting from bed to chair? no Climbing a flight of stairs? yes Preparing food and eating?: yes Bathing or showering? yes Getting dressed: yes Getting to the toilet? yes Using the toilet: no Moving around from place to place: yes In the past year have you fallen or had a near fall?: no  Hearing Difficulties:  Do you often ask people to speak up or repeat themselves? yes Do you experience ringing or noises in your ears? no  Do you have difficulty  understanding soft or whispered voices? yes  Memory Difficulties:  Do you feel that you have a problem with memory? yes  Do you often misplace items? yes  Do you feel safe at home?  yes  Sexual Health:   Are you sexually active?  No  Do you have more than one partner?  No  Advanced Directives:   Advanced directives discussed: has an advanced directive - a copy HAS NOT been provided. DNR order is discussed with patient and her daughter, she  does not want CPR or intubation under any circumstances   Additional information provided: yes  Risk Factors  Current exercise habits: walking  Dietary issues discussed:no concernd  Cardiac risk factors: known cardiac disease, hypertension, hypercholesterolemia/hyperlipidemia, generalized debilitation, positive family history   Exam:  BP 131/64 (BP Location: Left Arm, Patient Position: Sitting, Cuff Size: Normal)   Pulse 80   Temp 98.5 F (36.9 C) (Oral)   Wt 152 lb 9.6 oz (69.2 kg)   BMI 25.39 kg/m    Constitutional: VS see above. General Appearance: alert, well-developed, well-nourished, NAD  Ears, Nose, Mouth, Throat: MMM  Neck: No masses, trachea midline.   Respiratory: Normal respiratory effort. no wheeze, no rhonchi, no rales  Cardiovascular:No lower extremity edema. +murmur, normal S1S2  Musculoskeletal: Gait normal. No clubbing/cyanosis of digits. Tenderness R lower paraspinal lumbar region, neg SLR, can ambulate with assistance of cane  Neurological: Normal balance/coordination. No tremor. See nurse notes for MSE   Skin: warm, dry, intact. No rash/ulcer.   Psychiatric: Normal judgment/insight. Normal mood and affect. Oriented x3.    Dg Lumbar Spine 2-3 Views  Result Date: 06/07/2017 CLINICAL DATA:  Low back pain for several hours EXAM: LUMBAR SPINE - 2-3 VIEW COMPARISON:  03/01/2017 FINDINGS: Five lumbar type vertebral bodies are well visualized. Vertebral body height is well maintained. Mild anterolisthesis is noted of  L4 on L5. Aortic atherosclerotic changes are seen. Disc space narrowing is noted at L4-5 and L5-S1. IMPRESSION: Degenerative change without acute abnormality. Electronically Signed   By: Inez Catalina M.D.   On: 06/07/2017 08:47    ASSESSMENT/PLAN:   Encounter for Medicare annual wellness exam  Acute right-sided low back pain without sciatica - Plan: DG Lumbar Spine 2-3 Views  Coronary artery disease involving native coronary artery of native heart without angina pectoris  CKD (chronic kidney disease) stage 4, GFR 15-29 ml/min (HCC)  Dementia without behavioral disturbance, unspecified dementia type  MGUS (monoclonal gammopathy of unknown significance)  CANCER SCREENING  Lung - USPSTF: 55-80yo w/ 30 py hx unless quit w/in 32yr- does not need  Colon - does not need  Prostate - does not need  Breast - does not need  Cervical - does not need OTHER DISEASE SCREENING  Lipid - does not need  DM2 - does not need  AAA - female 641-75yoever smoked - does not need  Osteoporosis - women 82yo+, men 82yo+ - needs INFECTIOUS DISEASE SCREENING  HIV - does not need  GC/CT - does not need  HepC -does not need  TB - does not need ADULT VACCINATION  Influenza - annual vaccine recommended  Td - booster every 10 years - does not need  Zoster - option at 513 yes at 671+- needs  PCV13 - does not need  PPSV23 - does not need Immunization History  Administered Date(s) Administered  . Influenza, High Dose Seasonal PF 10/11/2016  . Influenza-Unspecified 10/31/2015  . Pneumococcal Conjugate-13 11/19/2014  . Pneumococcal Polysaccharide-23 09/12/1997  . Td 05/23/2013   OTHER  Fall - exercise and Vit D age 72+ - needs  Advanced Directives -  Discussed as above   During the course of the visit the patient was educated and counseled about appropriate screening and preventive services as noted above.   Patient Instructions (the written plan) was given to the patient.  Medicare  Attestation I have personally reviewed: The patient's medical and social history Their use of alcohol, tobacco or illicit drugs Their current medications and supplements The  patient's functional ability including ADLs,fall risks, home safety risks, cognitive, and hearing and visual impairment Diet and physical activities Evidence for depression or mood disorders  The patient's weight, height, BMI, and visual acuity have been recorded in the chart.  I have made referrals, counseling, and provided education to the patient based on review of the above and I have provided the patient with a written personalized care plan for preventive services.     Emeterio Reeve, DO   06/06/17   Visit summary with medication list and pertinent instructions was printed for patient to review. All questions at time of visit were answered - patient instructed to contact office with any additional concerns. ER/RTC precautions were reviewed with the patient.   Follow-up plan: Return for LABS 10/2017.

## 2017-06-07 ENCOUNTER — Encounter: Payer: Self-pay | Admitting: Osteopathic Medicine

## 2017-07-10 ENCOUNTER — Other Ambulatory Visit: Payer: Self-pay | Admitting: Osteopathic Medicine

## 2017-07-10 DIAGNOSIS — K219 Gastro-esophageal reflux disease without esophagitis: Secondary | ICD-10-CM

## 2017-07-11 NOTE — Telephone Encounter (Signed)
[facility] pharmacy requesting med RF for omeprazole.

## 2017-07-11 NOTE — Telephone Encounter (Signed)
Pt's daughter [name]) has been updated.

## 2017-08-06 ENCOUNTER — Other Ambulatory Visit: Payer: Self-pay | Admitting: Osteopathic Medicine

## 2017-08-06 DIAGNOSIS — Z8679 Personal history of other diseases of the circulatory system: Secondary | ICD-10-CM

## 2017-08-11 DIAGNOSIS — H40023 Open angle with borderline findings, high risk, bilateral: Secondary | ICD-10-CM | POA: Diagnosis not present

## 2017-09-02 ENCOUNTER — Encounter: Payer: Medicare HMO | Admitting: Osteopathic Medicine

## 2017-10-11 ENCOUNTER — Other Ambulatory Visit: Payer: Self-pay | Admitting: Osteopathic Medicine

## 2017-10-11 DIAGNOSIS — K219 Gastro-esophageal reflux disease without esophagitis: Secondary | ICD-10-CM

## 2017-11-07 ENCOUNTER — Ambulatory Visit (INDEPENDENT_AMBULATORY_CARE_PROVIDER_SITE_OTHER): Payer: Medicare HMO | Admitting: Osteopathic Medicine

## 2017-11-07 ENCOUNTER — Encounter: Payer: Self-pay | Admitting: Osteopathic Medicine

## 2017-11-07 VITALS — BP 128/60 | HR 75 | Temp 97.9°F | Wt 155.3 lb

## 2017-11-07 DIAGNOSIS — F0390 Unspecified dementia without behavioral disturbance: Secondary | ICD-10-CM | POA: Diagnosis not present

## 2017-11-07 DIAGNOSIS — I2510 Atherosclerotic heart disease of native coronary artery without angina pectoris: Secondary | ICD-10-CM | POA: Diagnosis not present

## 2017-11-07 DIAGNOSIS — I10 Essential (primary) hypertension: Secondary | ICD-10-CM | POA: Diagnosis not present

## 2017-11-07 DIAGNOSIS — I272 Pulmonary hypertension, unspecified: Secondary | ICD-10-CM | POA: Diagnosis not present

## 2017-11-07 DIAGNOSIS — Z23 Encounter for immunization: Secondary | ICD-10-CM | POA: Diagnosis not present

## 2017-11-07 DIAGNOSIS — I5042 Chronic combined systolic (congestive) and diastolic (congestive) heart failure: Secondary | ICD-10-CM

## 2017-11-07 DIAGNOSIS — D472 Monoclonal gammopathy: Secondary | ICD-10-CM

## 2017-11-07 DIAGNOSIS — N184 Chronic kidney disease, stage 4 (severe): Secondary | ICD-10-CM

## 2017-11-07 NOTE — Progress Notes (Signed)
HPI: Madeline Atkinson is a 84 y.o. female who  has a past medical history of Acid reflux, Allergic rhinitis, Alzheimer disease, Anxiety, Aortic insufficiency, Back pain, CAD (coronary artery disease), native coronary artery, Chronic back pain, Chronic combined systolic and diastolic CHF (congestive heart failure) ([place]), CKD (chronic kidney disease), stage III ([place]), Cold intolerance, Depression, DNR (do not resuscitate), Essential hypertension, GERD (gastroesophageal reflux disease), H. pylori infection, Hyperlipidemia LDL goal <70 (03/17/2016), Mastodynia, MI, old, Mild anemia, OA (osteoarthritis), Pain in gums, Pulmonary hypertension (HCC), Seasonal allergies, Tremor, Urinary incontinence, and Vitamin D deficiency.  she presents to [facility] today, 11/07/17,  for chief complaint of:  Routine follow-up: see headings below  CAD/CHF, pulmonary hypertension, aortic insufficiency: Following with cardiology, last visit with Dr. Radford Pax 05/04/2017.  Continuing aspirin, Lipitor, Imdur, Lasix.  Taken off beta-blocker due to bradycardia.  Medical rather than surgical management due to advanced age and dementia.  Advised that she follow-up in 6 months, I do not see any appointment on file and she is due  MGUS: Following with heme-onc, last visit with Dr. Lebron Conners 03/07/2017.  Recommended follow-up blood work by PCP, to include SPEP, quantitative immunoglobulins, light chains q. 6 months return for hematology follow-up as needed.  Patient states today she is doing well, daughter has no questions or concerns.  There was some minor temporary increased lower extremity swelling which seems to have resolved with getting stricter about lower salt foods. No shortness of breath, no chest pain.    Patient is accompanied by daughter who assists with history-taking. She has no additional questions/concerns  Past medical history, surgical history, and family history reviewed.  Current  medication list and allergy/intolerance information reviewed.   (See remainder of HPI, ROS, Phys Exam below)     ASSESSMENT/PLAN: The primary encounter diagnosis was Benign essential HTN. Diagnoses of Chronic combined systolic and diastolic heart failure ([place]), Coronary artery disease involving native coronary artery of native heart without angina pectoris, Pulmonary hypertension ([place]), Dementia without behavioral disturbance, unspecified dementia type ([place]), MGUS (monoclonal gammopathy of unknown significance), and CKD (chronic kidney disease) stage 4, GFR 15-29 ml/min (HCC) were also pertinent to this visit.   We will get routine labs today, aloe up with heme/onc for MGUS depending on results   Orders Placed This Encounter  Procedures  . Flu vaccine HIGH DOSE PF (Fluzone High dose)  . Protein electrophoresis, serum  . Kappa/lambda light chains  . Beta 2 microglobulin, serum  . Lactate dehydrogenase  . CBC with Differential/Platelet  . COMPLETE METABOLIC PANEL WITH GFR  . TSH  . Urinalysis, Routine w reflex microscopic       Follow-up plan: Return in about 6 months (around 05/09/2018) for MEDICARE WELLNESS VISIT W/ KIM, CAN SEE ME TOO SAME DAY FOR LABS/ANNUAL, SOONER IF NEEDED.            ############################################ ############################################ ############################################ ############################################    Outpatient Encounter Medications as of 11/07/2017  Medication Sig  . acetaminophen (TYLENOL) 500 MG tablet Take 1 tablet (500 mg total) by mouth every 6 (six) hours as needed.  Marland Kitchen aspirin 81 MG tablet Take 81 mg by mouth daily.  Marland Kitchen atorvastatin (LIPITOR) 20 MG tablet TAKE 1 TABLET (20 MG TOTAL) BY MOUTH DAILY.  . chlorhexidine gluconate, MEDLINE KIT, (PERIDEX) 0.12 % solution Use as directed 15 mLs in the mouth or throat 2 (two) times daily.  . Cholecalciferol (VITAMIN D PO) Take by mouth. Vitamin D  Hydroxy 25 daily  . clotrimazole (  LOTRIMIN) 1 % cream Apply 1 application topically 2 (two) times daily.  . diclofenac sodium (VOLTAREN) 1 % GEL Apply 4 g topically 4 (four) times daily. Apply to affected painful area  . donepezil (ARICEPT) 10 MG tablet Take 1 tablet (10 mg total) by mouth at bedtime.  . furosemide (LASIX) 40 MG tablet TAKE 1 TABLET EVERY DAY  . gatifloxacin (ZYMAXID) 0.5 % SOLN Place 1 drop into the right eye 4 (four) times daily. 2 DAYS AFTER EYE INJECTIONS  . isosorbide mononitrate (IMDUR) 30 MG 24 hr tablet Take 1 tablet (30 mg total) by mouth daily. (Patient not taking: Reported on 06/06/2017)  . lisinopril (PRINIVIL,ZESTRIL) 2.5 MG tablet Take 1 tablet (2.5 mg total) by mouth daily.  Marland Kitchen omeprazole (PRILOSEC) 40 MG capsule TAKE 1 CAPSULE (40 MG TOTAL) BY MOUTH DAILY.  . traZODone (DESYREL) 100 MG tablet Take 1 tablet (100 mg total) by mouth at bedtime.   No facility-administered encounter medications on file as of 11/07/2017.    Allergies  Allergen Reactions  . Codeine Nausea And Vomiting      Review of Systems:  Constitutional: No recent illness  HEENT: No  headache, no vision change  Cardiac: No  chest pain, No  pressure, No palpitations  Respiratory:  No  shortness of breath. No  Cough  Hem/Onc: No  easy bruising/bleeding, No  abnormal lumps/bumps  Neurologic: No  weakness, No  Dizziness  Psychiatric: No  concerns with depression, No  concerns with anxiety  Exam:  BP 128/60 (BP Location: Left Arm, Patient Position: Sitting, Cuff Size: Normal)   Pulse 75   Temp 97.9 F (36.6 C) (Oral)   Wt 155 lb 4.8 oz (70.4 kg)   BMI 25.84 kg/m   Constitutional: VS see above. General Appearance: alert, well-developed, well-nourished, NAD  Eyes: Normal lids and conjunctive, non-icteric sclera  Ears, Nose, Mouth, Throat: MMM, Normal external inspection ears/nares/mouth/lips/gums.  Neck: No masses, trachea midline.   Respiratory: Normal respiratory effort. no  wheeze, no rhonchi, no rales  Cardiovascular: S1/S2 normal, +aortic murmur, no rub/gallop auscultated. RRR.   Musculoskeletal: Gait normal. Symmetric and independent movement of all extremities  Neurological: Normal balance/coordination. No tremor.  Skin: warm, dry, intact.   Psychiatric: Normal judgment/insight. Normal mood and affect. Oriented x3.   Visit summary with medication list and pertinent instructions was printed for patient to review, advised to alert [place] if any changes needed. All questions at time of visit were answered - patient instructed to contact office with any additional concerns. ER/RTC precautions were reviewed with the patient and understanding verbalized.   Follow-up plan: Return in about 6 months (around 05/09/2018) for [place], CAN SEE ME TOO SAME DAY FOR LABS/ANNUAL, SOONER IF NEEDED.    Please note: voice recognition software was used to produce this document, and typos may escape review. Please contact Dr. Sheppard Coil for any needed clarifications.

## 2017-11-18 DIAGNOSIS — N2581 Secondary hyperparathyroidism of renal origin: Secondary | ICD-10-CM | POA: Diagnosis not present

## 2017-11-18 DIAGNOSIS — I129 Hypertensive chronic kidney disease with stage 1 through stage 4 chronic kidney disease, or unspecified chronic kidney disease: Secondary | ICD-10-CM | POA: Diagnosis not present

## 2017-11-18 DIAGNOSIS — N189 Chronic kidney disease, unspecified: Secondary | ICD-10-CM | POA: Diagnosis not present

## 2017-11-18 DIAGNOSIS — D631 Anemia in chronic kidney disease: Secondary | ICD-10-CM | POA: Diagnosis not present

## 2017-11-18 DIAGNOSIS — N183 Chronic kidney disease, stage 3 (moderate): Secondary | ICD-10-CM | POA: Diagnosis not present

## 2018-01-26 ENCOUNTER — Other Ambulatory Visit: Payer: Self-pay | Admitting: Osteopathic Medicine

## 2018-01-26 DIAGNOSIS — F5101 Primary insomnia: Secondary | ICD-10-CM

## 2018-01-28 ENCOUNTER — Other Ambulatory Visit: Payer: Self-pay | Admitting: Osteopathic Medicine

## 2018-01-28 DIAGNOSIS — K219 Gastro-esophageal reflux disease without esophagitis: Secondary | ICD-10-CM

## 2018-02-06 DIAGNOSIS — H40023 Open angle with borderline findings, high risk, bilateral: Secondary | ICD-10-CM | POA: Diagnosis not present

## 2018-02-06 DIAGNOSIS — H353131 Nonexudative age-related macular degeneration, bilateral, early dry stage: Secondary | ICD-10-CM | POA: Diagnosis not present

## 2018-02-06 DIAGNOSIS — H25012 Cortical age-related cataract, left eye: Secondary | ICD-10-CM | POA: Diagnosis not present

## 2018-02-06 DIAGNOSIS — H34831 Tributary (branch) retinal vein occlusion, right eye, with macular edema: Secondary | ICD-10-CM | POA: Diagnosis not present

## 2018-02-28 ENCOUNTER — Other Ambulatory Visit: Payer: Self-pay | Admitting: Osteopathic Medicine

## 2018-02-28 DIAGNOSIS — Z8679 Personal history of other diseases of the circulatory system: Secondary | ICD-10-CM

## 2018-02-28 DIAGNOSIS — I2510 Atherosclerotic heart disease of native coronary artery without angina pectoris: Secondary | ICD-10-CM

## 2018-03-16 ENCOUNTER — Other Ambulatory Visit: Payer: Self-pay

## 2018-03-16 ENCOUNTER — Ambulatory Visit (HOSPITAL_COMMUNITY)
Admission: EM | Admit: 2018-03-16 | Discharge: 2018-03-16 | Disposition: A | Discharge status: Home / Self Care | Payer: Medicare HMO | Attending: Family Medicine | Admitting: Family Medicine

## 2018-03-16 ENCOUNTER — Encounter (HOSPITAL_COMMUNITY): Payer: Self-pay | Admitting: Emergency Medicine

## 2018-03-16 DIAGNOSIS — R531 Weakness: Secondary | ICD-10-CM | POA: Diagnosis not present

## 2018-03-16 DIAGNOSIS — I5040 Unspecified combined systolic (congestive) and diastolic (congestive) heart failure: Secondary | ICD-10-CM

## 2018-03-16 DIAGNOSIS — Z8679 Personal history of other diseases of the circulatory system: Secondary | ICD-10-CM

## 2018-03-16 DIAGNOSIS — R9431 Abnormal electrocardiogram [ECG] [EKG]: Secondary | ICD-10-CM

## 2018-03-16 LAB — POCT URINALYSIS DIP (DEVICE)
BILIRUBIN URINE: NEGATIVE
Glucose, UA: NEGATIVE mg/dL
Hgb urine dipstick: NEGATIVE
Nitrite: NEGATIVE
Protein, ur: 100 mg/dL — AB
Specific Gravity, Urine: 1.025 (ref 1.005–1.030)
Urobilinogen, UA: 0.2 mg/dL (ref 0.0–1.0)
pH: 5.5 (ref 5.0–8.0)

## 2018-03-16 NOTE — Discharge Instructions (Addendum)
See your cardiologist next week Call your PCP tomorrow Return here or go to ER if gets worse

## 2018-03-16 NOTE — ED Provider Notes (Addendum)
Madeline Atkinson    CSN: [id number] Arrival date & time: 03/16/18  1452     History   Chief Complaint Chief Complaint  Patient presents with  . Fatigue    multiple complaints    HPI Madeline Atkinson is a 85 y.o. female.   HPI  Brought in by her daughter for evaluation of declining function over the last 2 to 3 weeks.  Daughter understands that she will slowly decline in function at the age of 58 with Alzheimer's disease and multiple medical problems, but the last couple of weeks she seems to be worse than usual.  She is moving more slowly and holding onto furniture.  She is a little more forgetful.  She is a little more winded.  Her appetite is hit and miss.  She goes to a Alzheimer's daycare and the nurse states she is come to her office daily for the last 3 visits for insignificant reasons, does not really know why.  She is on multiple medicines, she sees multiple specialists, she is compliant with her medical treatments.  She lives with daughter who prepares all meals for her and cares for her. No nausea or vomiting.  No constipation or diarrhea.  No recent illness, cough, fever.  No urinary complaints, incontinence, frequency.  No chest pain, dyspnea on exertion, increased pedal edema.  Daughter states her feet feel more cold, hands are warm.   Past Medical History:  Diagnosis Date  . Acid reflux   . Allergic rhinitis   . Alzheimer disease ([place])   . Anxiety   . Aortic insufficiency    a. mild to moderate by echo 02/2015.  . Back pain   . CAD (coronary artery disease), native coronary artery    NSTEMI 10/2014 - medical management due to frailty.  . Chronic back pain   . Chronic combined systolic and diastolic CHF (congestive heart failure) (HCC)    a. EF 20-25% with severe global LV dysfunction 10/2014. b. repeat echo 02/2015 with EF 07-37%, grade 2 diastolic dysfunction, mild RV dysfunctoin with akinetic RV, severe biatrial enlargement.  . CKD (chronic kidney disease), stage  III ([place])   . Cold intolerance   . Depression   . DNR (do not resuscitate)   . Essential hypertension   . GERD (gastroesophageal reflux disease)   . H. pylori infection   . Hyperlipidemia LDL goal <70 03/17/2016  . Mastodynia   . MI, old   . Mild anemia   . OA (osteoarthritis)   . Pain in gums   . Pulmonary hypertension ([place])    a. PASP 48mmHg by echo 02/2015  . Seasonal allergies   . Tremor   . Urinary incontinence   . Vitamin D deficiency     Patient Active Problem List   Diagnosis Date Noted  . MGUS (monoclonal gammopathy of unknown significance) 03/21/2017  . CKD (chronic kidney disease) stage 4, GFR 15-29 ml/min (HCC) 11/18/2016  . Allergic rhinitis   . Anxiety   . Chronic back pain   . Alzheimer disease ([place])   . Depression   . Tremor   . Benign essential HTN   . Mastodynia   . OA (osteoarthritis)   . Urinary incontinence   . H. pylori infection   . Pulmonary hypertension ([place])   . Aortic insufficiency   . DNR (do not resuscitate)   . Chronic combined systolic and diastolic heart failure ([place]) 03/17/2016  . Hyperlipidemia LDL goal <70 03/17/2016  . History of MI (myocardial  infarction) 02/19/2016  . Coronary artery disease involving native coronary artery of native heart without angina pectoris 02/19/2016  . Dementia ([place]) 02/19/2016  . GERD (gastroesophageal reflux disease) 02/19/2016  . Back pain 02/19/2016  . Cold intolerance 02/19/2016    Past Surgical History:  Procedure Laterality Date  . BREAST LUMPECTOMY    . CHOLECYSTECTOMY      OB History   No obstetric history on file.      Home Medications    Prior to Admission medications   Medication Sig Start Date End Date Taking? Authorizing Provider  acetaminophen (TYLENOL) 500 MG tablet Take 1 tablet (500 mg total) by mouth every 6 (six) hours as needed. 01/26/17   Emeterio Reeve, DO  aspirin 81 MG tablet Take 81 mg by mouth daily.    [provider]  atorvastatin (LIPITOR) 20 MG  tablet TAKE 1 TABLET (20 MG TOTAL) BY MOUTH DAILY. 02/08/17   Emeterio Reeve, DO  Cholecalciferol (VITAMIN D PO) Take by mouth. Vitamin D Hydroxy 25 daily    [provider]  clotrimazole (LOTRIMIN) 1 % cream Apply 1 application topically 2 (two) times daily. 06/18/16   Emeterio Reeve, DO  furosemide (LASIX) 40 MG tablet TAKE 1 TABLET EVERY DAY 02/28/18   Emeterio Reeve, DO  gatifloxacin (ZYMAXID) 0.5 % SOLN Place 1 drop into the right eye 4 (four) times daily. 2 DAYS AFTER EYE INJECTIONS    [provider]  isosorbide mononitrate (IMDUR) 30 MG 24 hr tablet TAKE 1 TABLET BY MOUTH EVERY DAY 02/28/18   Emeterio Reeve, DO  lisinopril (PRINIVIL,ZESTRIL) 2.5 MG tablet TAKE 1 TABLET BY MOUTH EVERY DAY 02/28/18   Emeterio Reeve, DO  omeprazole (PRILOSEC) 40 MG capsule TAKE 1 CAPSULE (40 MG TOTAL) BY MOUTH DAILY. 01/30/18   Emeterio Reeve, DO  traZODone (DESYREL) 100 MG tablet TAKE 1 TABLET BY MOUTH EVERYDAY AT BEDTIME 01/26/18   Emeterio Reeve, DO    Family History Family History  Problem Relation Age of Onset  . Heart disease Mother   . Heart failure Mother   . Heart disease Father   . Heart attack Father     Social History Social History   Tobacco Use  . Smoking status: Never Smoker  . Smokeless tobacco: Never Used  Substance Use Topics  . Alcohol use: No  . Drug use: No     Allergies   Codeine   Review of Systems Review of Systems  Constitutional: Positive for activity change, appetite change and fatigue. Negative for chills and fever.  HENT: Negative for dental problem, ear pain, postnasal drip, rhinorrhea and sore throat.   Eyes: Negative for pain and visual disturbance.  Respiratory: Positive for chest tightness and shortness of breath. Negative for cough.        Seems more short of breath with walking  Cardiovascular: Positive for leg swelling. Negative for chest pain and palpitations.       Feet feel cold  Gastrointestinal: Negative  for abdominal pain, constipation, diarrhea, nausea and vomiting.  Genitourinary: Negative for dysuria and hematuria.  Musculoskeletal: Positive for gait problem. Negative for arthralgias and back pain.       Holding holding onto furniture, balance for  Skin: Negative for color change and rash.  Neurological: Negative for dizziness, seizures, syncope and headaches.  Psychiatric/Behavioral: Positive for decreased concentration.       More confused at times  All other systems reviewed and are negative.    Physical Exam Triage Vital Signs ED Triage Vitals  Enc Vitals Group     BP 03/16/18 1504 (!) 127/98     Pulse Rate 03/16/18 1504 82     Resp 03/16/18 1504 18     Temp 03/16/18 1504 (!) 97.4 F (36.3 C)     Temp Source 03/16/18 1504 Temporal     SpO2 03/16/18 1504 97 %     Weight 03/16/18 1508 152 lb (68.9 kg)     Height --      Head Circumference --      Peak Flow --      Pain Score 03/16/18 1508 5     Pain Loc --      Pain Edu? --      Excl. in [place]? --    No data found.  Updated Vital Signs BP (!) 127/98   Pulse 82   Temp (!) 97.4 F (36.3 C) (Temporal)   Resp 18   Wt 68.9 kg   SpO2 97%   BMI 25.29 kg/m       Physical Exam Constitutional:      General: She is not in acute distress.    Appearance: Normal appearance. She is not ill-appearing.     Comments: Pleasant.  Looks to daughter for some answers.  Cooperative  HENT:     Head: Normocephalic and atraumatic.     Nose: Nose normal.     Mouth/Throat:     Mouth: Mucous membranes are moist.     Comments: Edentulous Eyes:     Pupils: Pupils are equal, round, and reactive to light.  Neck:     Musculoskeletal: Normal range of motion.     Vascular: No carotid bruit.  Cardiovascular:     Rate and Rhythm: Normal rate. Rhythm irregular.     Heart sounds: Murmur present.  Pulmonary:     Effort: Pulmonary effort is normal.     Breath sounds: Normal breath sounds. No rales.  Abdominal:     General: There is no  distension.     Palpations: Abdomen is soft.     Tenderness: There is no abdominal tenderness.  Musculoskeletal:     Right lower leg: Edema present.     Left lower leg: Edema present.  Skin:    General: Skin is warm and dry.     Comments: Feet are cool, pedal pulses 1+  Neurological:     General: No focal deficit present.     Mental Status: She is alert.  Psychiatric:     Comments: Some inappropriate jovialty, laughter      UC Treatments / Results  Labs (all labs ordered are listed, but only abnormal results are displayed) Labs Reviewed  POCT URINALYSIS DIP (DEVICE) - Abnormal; Notable for the following components:      Result Value   Ketones, ur TRACE (*)    Protein, ur 100 (*)    Leukocytes,Ua SMALL (*)    All other components within normal limits    EKG EKG has a rate of 76 with an undetermined rhythm, left axis deviation, nonspecific intraventricular block.  There is a lateral infarct of age undetermined, changed from prior EKG that had sinus rhythm, September 2018 I called to [facility] cardiology to speak with Dr. on [id number] Radiology No results found.  Procedures Procedures (including critical care time)  Medications Ordered in UC Medications - No data to display  Initial Impression / Assessment and Plan / UC Course  I have reviewed the triage vital signs and the nursing notes.  Pertinent labs &  imaging results that were available during my care of the patient were reviewed by me and considered in my medical decision making (see chart for details).     I explained to the daughter the extreme difficulty in determining a slight decrease in function in a 85 year old with multiple medical problems.  I do not find any obvious abnormality.  No UTI suspected, no symptoms.  She does not appear to have specific cardiac symptoms although the dyspnea, pedal edema, and decreased strength could be cardiac.  She does have EKG changes.  I spoke to the cardiologist.  Dr. Meda Coffee  tells me that her EKG is not that much change from prior.  She is concerned about the changes, however, they will call Ms. Reed to come to the heart center next week. No evidence of infectious disease, UTI, URI that would cause weakness She does have some Alzheimer's disease and her forgetfulness waxes and wanes.  I did discuss with the daughter that this is not unusual although she should not have an abrupt decline Her appetite is not good, she is sleeping more, but she has not lost weight Greater than 50% of this visit was spent in counseling and coordinating care.  Total face to face time:   60 minutes and spent evaluation, consulting, reviewing chart, discussing condition with family Final Clinical Impressions(s) / UC Diagnoses   Final diagnoses:  Weakness generalized  Nonspecific abnormal electrocardiogram (ECG) (EKG)  History of coronary artery disease  Combined systolic and diastolic congestive heart failure, unspecified HF chronicity [facility])     Discharge Instructions     See your cardiologist next week Call your PCP tomorrow Return here or go to ER if gets worse    ED Prescriptions    None     Controlled Substance Prescriptions [place] Controlled Substance Registry consulted? Not Applicable   Raylene Everts, MD 03/16/18 1806    Raylene Everts, MD 03/16/18 [phone]

## 2018-03-20 NOTE — Progress Notes (Signed)
Cardiology Office Note   Date:  03/21/2018   ID:  Madeline Atkinson, DOB 07/24/1924, MRN [id number]  PCP:  Emeterio Reeve, DO  Cardiologist: Dr. Fransico Him, MD   Chief Complaint  Patient presents with  . Follow-up  . Fatigue  . Chest Pain    History of Present Illness: Madeline Atkinson is a 85 y.o. female who presents for follow up for abnormal EKG and acute change in mental and functional status, seen for Dr. Radford Pax.   Madeline Atkinson has a prior hx of GERD, dementia, chronic combined systolic/diastolic CHF (EF 71-69% 6789), mild-moderate AI, severe pulm HTN, CAD (remote MI, then NSTEMI 10/2014 managed medically with no cath), CKD stage III by labs, mild anemia, hyperlipidemia. Per chart review, she had MI years ago and then in October of 2016 NSTEMI with no cath due to advanced age. Prior anginal equivalent was upper abdominal pain. Echo 02/2015 with LVEF 10-15%, severe global diffuse HK, extensive anterior, anteroseptal, septal and apical akinesis, grade 2 DD, mildly reduced RV function, akinesis of RV apex, severely dilated LA/RA, mild MR, mild-mod AI, mild TR, severe pulm HTN with PASP 36mmHg. She established care with Dr. Radford Pax in 02/2016 and was doing well at that time.   She was last seen by Dr. Radford Pax 05/04/2017 and was noted to be doing well from a cardiac perspective. She denied anginal symptoms.   Unfortunately she was seen in an urgent care 03/16/2018 for generalized weakness and acute change in mental functioning. Family reported that her function had been declining over the last 2 to 3 weeks prior to being seen, however she had an acute change after dropping her mother off at the adult day care center. She states that she was called by the facility who reported that the patient was not "acting like herself." Madeline Atkinson took her to an UC center in which she underwent testing for acut infection. An EKG was performed which showed an undetermined rhythm with left axis deviation and  nonspecific  intraventricular block. Dr. Meda Coffee, on call MD at the time, was asked to review the EKG remotely in which she stated that the EKG had not changed much since prior tracing. There is no acute change per my review today.   Today the patient presents with her Madeline Atkinson and is concerned with the changes in her mothers functional abilities. Madeline Atkinson is her caregiver and reports that prior to last week, she was walking with a cane (although did not need this much) and was able to care for herself very well. She has baseline Alzheimer's disease but is able to converse well. On my exam, the patient is reporting mild SOB with "exertion" and she has been having vague complaints of chest tightness. She denies nausea or diaphoresis, but states that she has been "belching" more lately. Her prior anginal presentation in 2016 was abdominal pain. She denies LE swelling or orthopnea symptoms. She keeps repeating how "tired she is" on exam today. I fear that she may have had a cardiac event which precipitated this acute change. The Madeline Atkinson, patient and I discussed that no further invasive procedure will be performed given her advanced age and multiple co-morbid conditions. They Madeline Atkinson really would just like some answers as to why all of these sudden changes. I have suggested that because our office is not fully equipped to adequately give her answers today, it may be more beneficial to be admitted to the hospital for full work up which should include both cardiology and internal  medicine teams. They understand that this will not be for aggressive cardiac treatment, but for answers. They are interested in more workup if this is related acute infection or other reversible issues. I talked to the family about a possible cardiac workup with troponin, CXR and possible echocardiogram, but our evaluation would not go much further than that given the above. They are ok with that ans really would just like some answers for piece of mind.    Past Medical History:  Diagnosis Date  . Acid reflux   . Allergic rhinitis   . Alzheimer disease (Madeline Atkinson)   . Anxiety   . Aortic insufficiency    a. mild to moderate by echo 02/2015.  . Back pain   . CAD (coronary artery disease), native coronary artery    NSTEMI 10/2014 - medical management due to frailty.  . Chronic back pain   . Chronic combined systolic and diastolic CHF (congestive heart failure) (HCC)    a. EF 20-25% with severe global LV dysfunction 10/2014. b. repeat echo 02/2015 with EF 68-34%, grade 2 diastolic dysfunction, mild RV dysfunctoin with akinetic RV, severe biatrial enlargement.  . CKD (chronic kidney disease), stage III ([place])   . Cold intolerance   . Depression   . DNR (do not resuscitate)   . Essential hypertension   . GERD (gastroesophageal reflux disease)   . H. pylori infection   . Hyperlipidemia LDL goal <70 03/17/2016  . Mastodynia   . MI, old   . Mild anemia   . OA (osteoarthritis)   . Pain in gums   . Pulmonary hypertension ([place])    a. PASP 52mmHg by echo 02/2015  . Seasonal allergies   . Tremor   . Urinary incontinence   . Vitamin D deficiency     Past Surgical History:  Procedure Laterality Date  . BREAST LUMPECTOMY    . CHOLECYSTECTOMY      No current facility-administered medications for this visit.    Current Outpatient Medications  Medication Sig Dispense Refill  . acetaminophen (TYLENOL) 500 MG tablet Take 1 tablet (500 mg total) by mouth every 6 (six) hours as needed. 30 tablet 0  . aspirin 81 MG tablet Take 81 mg by mouth daily.    Marland Kitchen atorvastatin (LIPITOR) 20 MG tablet TAKE 1 TABLET (20 MG TOTAL) BY MOUTH DAILY. 90 tablet 3  . Cholecalciferol (VITAMIN D PO) Take by mouth. Vitamin D Hydroxy 25 daily    . clotrimazole (LOTRIMIN) 1 % cream Apply 1 application topically 2 (two) times daily. 40 g 1  . gatifloxacin (ZYMAXID) 0.5 % SOLN Place 1 drop into the right eye 4 (four) times daily. 2 DAYS AFTER EYE INJECTIONS    . isosorbide  mononitrate (IMDUR) 30 MG 24 hr tablet TAKE 1 TABLET BY MOUTH EVERY DAY 90 tablet 0  . lisinopril (PRINIVIL,ZESTRIL) 2.5 MG tablet TAKE 1 TABLET BY MOUTH EVERY DAY 90 tablet 0  . omeprazole (PRILOSEC) 40 MG capsule TAKE 1 CAPSULE (40 MG TOTAL) BY MOUTH DAILY. 90 capsule 0  . traZODone (DESYREL) 100 MG tablet TAKE 1 TABLET BY MOUTH EVERYDAY AT BEDTIME 90 tablet 3   Facility-Administered Medications Ordered in Other Visits  Medication Dose Route Frequency Provider Last Rate Last Dose  . sodium chloride flush (NS) 0.9 % injection 3 mL  3 mL Intravenous Once Long, Wonda Olds, MD        Allergies:   Codeine   Social History:  The patient  reports that she has never  smoked. She has never used smokeless tobacco. She reports that she does not drink alcohol or use drugs.   Family History:  The patient's family history includes Heart attack in her father; Heart disease in her father and mother; Heart failure in her mother.    ROS:  Please see the history of present illness. Otherwise, review of systems are positive for none. All other systems are reviewed and negative.    PHYSICAL EXAM: VS:  BP 130/82   Pulse 84   Ht 5\' 5"  (1.651 m)   Wt 161 lb (73 kg)   SpO2 98%   BMI 26.79 kg/m  , BMI Body mass index is 26.79 kg/m.  General: Elderly, ill appearing, pleasant, NAD Skin: Warm, dry, intact  Head: Normocephalic, atraumatic, clear, moist mucus membranes. Neck: Negative for carotid bruits. Mild JVD Lungs:Clear to ausculation bilaterally. No wheezes, rales, or rhonchi. Breathing is unlabored. Cardiovascular: RRR with S1 S2. + murmur. No rubs, gallops, or LV heave appreciated. Abdomen: Soft, non-tender, non-distended with normoactive bowel sounds. No obvious abdominal masses. MSK: Strength and tone appear normal for age. 5/5 in all extremities Extremities: No edema. No clubbing or cyanosis. DP/PT pulses 2+ bilaterally Neuro: Alert and oriented to person only. No focal deficits. No facial  asymmetry. MAE spontaneously. Psych: Responds to questions somewhat appropriately with normal affect.     EKG:  EKG is ordered today. The ekg ordered today demonstrates NSR with incomplete intraventricular block and non-specific T wave abnormalities. No significant change from prior EKG and improvement in T wave inversion since 2016   Recent Labs: 03/21/2018: Hemoglobin 11.5; Platelets 174   Lipid Panel    Component Value Date/Time   CHOL 130 10/07/2016 1010   TRIG 79 10/07/2016 1010   HDL 47 10/07/2016 1010   CHOLHDL 2.8 10/07/2016 1010   LDLCALC 67 10/07/2016 1010      Wt Readings from Last 3 Encounters:  03/21/18 161 lb (73 kg)  03/16/18 152 lb (68.9 kg)  11/07/17 155 lb 4.8 oz (70.4 kg)     Other studies Reviewed: Additional studies/ records that were reviewed today include:  Echocardiogram 2017: See care everywhere/EPIC   ASSESSMENT AND PLAN:  1. Hx of CAD with new fatigue and functional decline: -Remote MI with NSTEMI in 2016 with medical management recommended given pt advanced age and advanced dementia>>no cath performed  -Last documented LVEF in 2017 10-15%>>medically managed -Presents for follow up from acute functional/mental change from approximately one week ago (seen at [facility]) now with reports of chest tightness and mild SOB during this time>>>I am concerned that she may have had a cardiac event which led her to have caused her acute changes. I understand that she has a hx of dementia, along with her advanced age, but prior to last week it appears that she was at least moderately functional -After long discussion with patient and family, they are interested in proceeding to the ED for further evaluation. I discussed that we could do blood work and possibly an echocardiogram to determine if this was an event, but would not pursue much more than that. I will have hospitalist service admit and cardiology will consult if necessary. If there are reversible reasons for her  acute change then they are interested in treatment but understand that no aggressive therapies will be performed. -I would suggest running a troponin level and CXR to evaluated for HF, although she does not appear to be fluid volume overloaded on exam -I have discussed this case with  Dr. Curt Bears (DOD) who is in agreement with the plan  -I have also reached out to card-master to update on the situation  -Continue ASA, low dose statin, Imdur 30  2. Chronic combine systolic and diastolic heart failure: -Last echocardiogram 2017 with LVEF at 10-15% -Continue lisinopril 2.5mg , lasix 20 -Creatinine, 1.3 on 03/01/2017 -No carvedilol in the setting of bradycardia   3. HTN: -Stable, no change   4. Aortic valve insufficiency:  -No repeat of echo given that she is not a surgical candidate   5. Pulmonary HTN: -Per echocardiogram, see above plan   6. HLD: -Goal less than 70mg /dl, last LDL  -Continue statin    Current medicines are reviewed at length with the patient today.  The patient does not have concerns regarding medicines.  The following changes have been made:  no change  Labs/ tests ordered today include: None   Orders Placed This Encounter  Procedures  . EKG 12-Lead    Disposition:   FU with Dr. Radford Pax in 3 months   Signed, Kathyrn Drown, NP  03/21/2018 [place], [place],   [address] Phone: [phone]; Fax: [phone]

## 2018-03-21 ENCOUNTER — Other Ambulatory Visit: Payer: Self-pay

## 2018-03-21 ENCOUNTER — Encounter (HOSPITAL_COMMUNITY): Payer: Self-pay

## 2018-03-21 ENCOUNTER — Emergency Department (HOSPITAL_COMMUNITY): Payer: Medicare HMO

## 2018-03-21 ENCOUNTER — Encounter: Payer: Self-pay | Admitting: Physician Assistant

## 2018-03-21 ENCOUNTER — Inpatient Hospital Stay (HOSPITAL_COMMUNITY)
Admission: EM | Admit: 2018-03-21 | Discharge: 2018-03-24 | DRG: 640 | Disposition: A | Discharge status: Home Health | Payer: Medicare HMO | Attending: Internal Medicine | Admitting: Internal Medicine

## 2018-03-21 ENCOUNTER — Ambulatory Visit: Payer: Medicare HMO | Admitting: Cardiology

## 2018-03-21 VITALS — BP 130/82 | HR 84 | Ht 65.0 in | Wt 161.0 lb

## 2018-03-21 DIAGNOSIS — R0789 Other chest pain: Secondary | ICD-10-CM | POA: Diagnosis not present

## 2018-03-21 DIAGNOSIS — R627 Adult failure to thrive: Secondary | ICD-10-CM | POA: Diagnosis present

## 2018-03-21 DIAGNOSIS — G309 Alzheimer's disease, unspecified: Secondary | ICD-10-CM | POA: Diagnosis not present

## 2018-03-21 DIAGNOSIS — I351 Nonrheumatic aortic (valve) insufficiency: Secondary | ICD-10-CM

## 2018-03-21 DIAGNOSIS — R05 Cough: Secondary | ICD-10-CM

## 2018-03-21 DIAGNOSIS — J90 Pleural effusion, not elsewhere classified: Secondary | ICD-10-CM | POA: Diagnosis not present

## 2018-03-21 DIAGNOSIS — Z9049 Acquired absence of other specified parts of digestive tract: Secondary | ICD-10-CM

## 2018-03-21 DIAGNOSIS — I5042 Chronic combined systolic (congestive) and diastolic (congestive) heart failure: Secondary | ICD-10-CM | POA: Diagnosis not present

## 2018-03-21 DIAGNOSIS — R531 Weakness: Secondary | ICD-10-CM | POA: Diagnosis not present

## 2018-03-21 DIAGNOSIS — Z885 Allergy status to narcotic agent status: Secondary | ICD-10-CM

## 2018-03-21 DIAGNOSIS — E785 Hyperlipidemia, unspecified: Secondary | ICD-10-CM | POA: Diagnosis present

## 2018-03-21 DIAGNOSIS — I10 Essential (primary) hypertension: Secondary | ICD-10-CM | POA: Diagnosis not present

## 2018-03-21 DIAGNOSIS — N184 Chronic kidney disease, stage 4 (severe): Secondary | ICD-10-CM | POA: Diagnosis present

## 2018-03-21 DIAGNOSIS — F419 Anxiety disorder, unspecified: Secondary | ICD-10-CM | POA: Diagnosis present

## 2018-03-21 DIAGNOSIS — Z6830 Body mass index (BMI) 30.0-30.9, adult: Secondary | ICD-10-CM

## 2018-03-21 DIAGNOSIS — E669 Obesity, unspecified: Secondary | ICD-10-CM | POA: Diagnosis present

## 2018-03-21 DIAGNOSIS — E860 Dehydration: Secondary | ICD-10-CM | POA: Diagnosis present

## 2018-03-21 DIAGNOSIS — I2510 Atherosclerotic heart disease of native coronary artery without angina pectoris: Secondary | ICD-10-CM | POA: Diagnosis not present

## 2018-03-21 DIAGNOSIS — B370 Candidal stomatitis: Secondary | ICD-10-CM | POA: Diagnosis present

## 2018-03-21 DIAGNOSIS — G9349 Other encephalopathy: Secondary | ICD-10-CM | POA: Diagnosis not present

## 2018-03-21 DIAGNOSIS — G8929 Other chronic pain: Secondary | ICD-10-CM | POA: Diagnosis present

## 2018-03-21 DIAGNOSIS — Z79899 Other long term (current) drug therapy: Secondary | ICD-10-CM

## 2018-03-21 DIAGNOSIS — I252 Old myocardial infarction: Secondary | ICD-10-CM

## 2018-03-21 DIAGNOSIS — I272 Pulmonary hypertension, unspecified: Secondary | ICD-10-CM | POA: Diagnosis not present

## 2018-03-21 DIAGNOSIS — F0280 Dementia in other diseases classified elsewhere without behavioral disturbance: Secondary | ICD-10-CM | POA: Diagnosis present

## 2018-03-21 DIAGNOSIS — R4182 Altered mental status, unspecified: Secondary | ICD-10-CM | POA: Diagnosis not present

## 2018-03-21 DIAGNOSIS — F329 Major depressive disorder, single episode, unspecified: Secondary | ICD-10-CM | POA: Diagnosis present

## 2018-03-21 DIAGNOSIS — R059 Cough, unspecified: Secondary | ICD-10-CM

## 2018-03-21 DIAGNOSIS — J302 Other seasonal allergic rhinitis: Secondary | ICD-10-CM | POA: Diagnosis present

## 2018-03-21 DIAGNOSIS — I130 Hypertensive heart and chronic kidney disease with heart failure and stage 1 through stage 4 chronic kidney disease, or unspecified chronic kidney disease: Secondary | ICD-10-CM | POA: Diagnosis not present

## 2018-03-21 DIAGNOSIS — E871 Hypo-osmolality and hyponatremia: Secondary | ICD-10-CM | POA: Diagnosis not present

## 2018-03-21 DIAGNOSIS — R1310 Dysphagia, unspecified: Secondary | ICD-10-CM | POA: Diagnosis present

## 2018-03-21 DIAGNOSIS — G9341 Metabolic encephalopathy: Secondary | ICD-10-CM | POA: Diagnosis not present

## 2018-03-21 DIAGNOSIS — B3781 Candidal esophagitis: Secondary | ICD-10-CM | POA: Diagnosis not present

## 2018-03-21 DIAGNOSIS — Z8249 Family history of ischemic heart disease and other diseases of the circulatory system: Secondary | ICD-10-CM

## 2018-03-21 DIAGNOSIS — K219 Gastro-esophageal reflux disease without esophagitis: Secondary | ICD-10-CM | POA: Diagnosis present

## 2018-03-21 DIAGNOSIS — N183 Chronic kidney disease, stage 3 (moderate): Secondary | ICD-10-CM | POA: Diagnosis not present

## 2018-03-21 DIAGNOSIS — Z66 Do not resuscitate: Secondary | ICD-10-CM | POA: Diagnosis not present

## 2018-03-21 DIAGNOSIS — I5043 Acute on chronic combined systolic (congestive) and diastolic (congestive) heart failure: Secondary | ICD-10-CM | POA: Diagnosis not present

## 2018-03-21 DIAGNOSIS — Z7982 Long term (current) use of aspirin: Secondary | ICD-10-CM

## 2018-03-21 LAB — I-STAT TROPONIN, ED: Troponin i, poc: 0.03 ng/mL (ref 0.00–0.08)

## 2018-03-21 LAB — COMPREHENSIVE METABOLIC PANEL
ALT: 74 U/L — AB (ref 0–44)
AST: 56 U/L — ABNORMAL HIGH (ref 15–41)
Albumin: 3.4 g/dL — ABNORMAL LOW (ref 3.5–5.0)
Alkaline Phosphatase: 88 U/L (ref 38–126)
Anion gap: 11 (ref 5–15)
BUN: 12 mg/dL (ref 8–23)
CALCIUM: 9.1 mg/dL (ref 8.9–10.3)
CO2: 17 mmol/L — ABNORMAL LOW (ref 22–32)
CREATININE: 1.4 mg/dL — AB (ref 0.44–1.00)
Chloride: 98 mmol/L (ref 98–111)
GFR calc Af Amer: 38 mL/min — ABNORMAL LOW (ref >60)
GFR calc non Af Amer: 33 mL/min — ABNORMAL LOW (ref >60)
Glucose, Bld: 114 mg/dL — ABNORMAL HIGH (ref 70–99)
Potassium: 4.7 mmol/L (ref 3.5–5.1)
Sodium: 126 mmol/L — ABNORMAL LOW (ref 135–145)
Total Bilirubin: 0.7 mg/dL (ref 0.3–1.2)
Total Protein: 7 g/dL (ref 6.5–8.1)

## 2018-03-21 LAB — CBC
HCT: 35 % — ABNORMAL LOW (ref 36.0–46.0)
Hemoglobin: 11.5 g/dL — ABNORMAL LOW (ref 12.0–15.0)
MCH: 27.7 pg (ref 26.0–34.0)
MCHC: 32.9 g/dL (ref 30.0–36.0)
MCV: 84.3 fL (ref 80.0–100.0)
Platelets: 174 10*3/uL (ref 150–400)
RBC: 4.15 MIL/uL (ref 3.87–5.11)
RDW: 15.2 % (ref 11.5–15.5)
WBC: 5 10*3/uL (ref 4.0–10.5)
nRBC: 0 % (ref 0.0–0.2)

## 2018-03-21 LAB — URINALYSIS, ROUTINE W REFLEX MICROSCOPIC
Bilirubin Urine: NEGATIVE
Glucose, UA: NEGATIVE mg/dL
Hgb urine dipstick: NEGATIVE
Ketones, ur: NEGATIVE mg/dL
Nitrite: NEGATIVE
Protein, ur: NEGATIVE mg/dL
Specific Gravity, Urine: 1.004 — ABNORMAL LOW (ref 1.005–1.030)
pH: 6 (ref 5.0–8.0)

## 2018-03-21 LAB — BRAIN NATRIURETIC PEPTIDE: B Natriuretic Peptide: 2566.1 pg/mL — ABNORMAL HIGH (ref 0.0–100.0)

## 2018-03-21 MED ORDER — SODIUM CHLORIDE 0.9 % IV SOLN
1.0000 g | Freq: Once | INTRAVENOUS | Status: AC
  Administered 2018-03-21: 1 g via INTRAVENOUS
  Filled 2018-03-21: qty 10

## 2018-03-21 MED ORDER — SODIUM CHLORIDE 0.9 % IV SOLN
INTRAVENOUS | Status: DC
  Administered 2018-03-22 – 2018-03-23 (×3): via INTRAVENOUS

## 2018-03-21 MED ORDER — SODIUM CHLORIDE 0.9% FLUSH: 3.0000 mL | Freq: Once | INTRAVENOUS | Status: DC

## 2018-03-21 NOTE — ED Triage Notes (Signed)
Pt here with daughter for AMS, daughter states the pt has not been herself since last week. Pt not talking as much, has been sleeping more and has not been eating much either. Pt alert and oriented to self, nad noted

## 2018-03-21 NOTE — Patient Instructions (Addendum)
  You have been recommended to go to [facility] Emergency room for further evaluation

## 2018-03-21 NOTE — ED Provider Notes (Signed)
[facility] EMERGENCY DEPARTMENT Provider Note   CSN: [id number] Arrival date & time: 03/21/18  1521    History   Chief Complaint Chief Complaint  Patient presents with  . Altered Mental Status    HPI Madeline Atkinson is a 85 y.o. female.     Patient is a 85 year old female with past medica inclusive of l history of Alzheimer's dementia, pulmonary hypertension, GERD, MI who is brought into the emergency department by her daughter for acute mental status change.  Daughter reports that she lives with her mother and at baseline she is very independent she cooks, she cleans, she walks independently with very little assistance.  Reports that about 1 week ago she had an abrupt change in mental status.  Reports that "she is not herself ".  Reports that she is not very interactive with anything anymore.  She used to be a active member of adult daycare but does not want to participate.  Also had an episode of bowel incontinence today which has never happened before. She has been more confused, complaining of some chest tightness and SOB occasionally, as well as needing lots of assistance while walking.  Patient was seen in urgent care just a few days ago who did UA and EKG.  Unremarkable findings on UA but she had some nonspecific changes in her EKG so she was told to follow-up with cardiology.  She did see cardiology today who thought that maybe her altered mental status might be related to his CHF exacerbation and told her she should come to the emergency department to be admitted for further work-up and have cardiology consult for possible echocardiogram.      Past Medical History:  Diagnosis Date  . Acid reflux   . Allergic rhinitis   . Alzheimer disease ([place])   . Anxiety   . Aortic insufficiency    a. mild to moderate by echo 02/2015.  . Back pain   . CAD (coronary artery disease), native coronary artery    NSTEMI 10/2014 - medical management due to frailty.  . Chronic back  pain   . Chronic combined systolic and diastolic CHF (congestive heart failure) (HCC)    a. EF 20-25% with severe global LV dysfunction 10/2014. b. repeat echo 02/2015 with EF 85-92%, grade 2 diastolic dysfunction, mild RV dysfunctoin with akinetic RV, severe biatrial enlargement.  . CKD (chronic kidney disease), stage III ([place])   . Cold intolerance   . Depression   . DNR (do not resuscitate)   . Essential hypertension   . GERD (gastroesophageal reflux disease)   . H. pylori infection   . Hyperlipidemia LDL goal <70 03/17/2016  . Mastodynia   . MI, old   . Mild anemia   . OA (osteoarthritis)   . Pain in gums   . Pulmonary hypertension ([place])    a. PASP 62mmHg by echo 02/2015  . Seasonal allergies   . Tremor   . Urinary incontinence   . Vitamin D deficiency     Patient Active Problem List   Diagnosis Date Noted  . MGUS (monoclonal gammopathy of unknown significance) 03/21/2017  . CKD (chronic kidney disease) stage 4, GFR 15-29 ml/min (HCC) 11/18/2016  . Allergic rhinitis   . Anxiety   . Chronic back pain   . Alzheimer disease ([place])   . Depression   . Tremor   . Benign essential HTN   . Mastodynia   . OA (osteoarthritis)   . Urinary incontinence   .  H. pylori infection   . Pulmonary hypertension ([place])   . Aortic insufficiency   . DNR (do not resuscitate)   . Chronic combined systolic and diastolic heart failure ([place]) 03/17/2016  . Hyperlipidemia LDL goal <70 03/17/2016  . History of MI (myocardial infarction) 02/19/2016  . Coronary artery disease involving native coronary artery of native heart without angina pectoris 02/19/2016  . Dementia ([place]) 02/19/2016  . GERD (gastroesophageal reflux disease) 02/19/2016  . Back pain 02/19/2016  . Cold intolerance 02/19/2016    Past Surgical History:  Procedure Laterality Date  . BREAST LUMPECTOMY    . CHOLECYSTECTOMY       OB History   No obstetric history on file.      Home Medications    Prior to Admission  medications   Medication Sig Start Date End Date Taking? Authorizing Provider  acetaminophen (TYLENOL) 500 MG tablet Take 1 tablet (500 mg total) by mouth every 6 (six) hours as needed. 01/26/17   Emeterio Reeve, DO  aspirin 81 MG tablet Take 81 mg by mouth daily.    [provider]  atorvastatin (LIPITOR) 20 MG tablet TAKE 1 TABLET (20 MG TOTAL) BY MOUTH DAILY. 02/08/17   Emeterio Reeve, DO  Cholecalciferol (VITAMIN D PO) Take by mouth. Vitamin D Hydroxy 25 daily    [provider]  clotrimazole (LOTRIMIN) 1 % cream Apply 1 application topically 2 (two) times daily. 06/18/16   Emeterio Reeve, DO  gatifloxacin (ZYMAXID) 0.5 % SOLN Place 1 drop into the right eye 4 (four) times daily. 2 DAYS AFTER EYE INJECTIONS    [provider]  isosorbide mononitrate (IMDUR) 30 MG 24 hr tablet TAKE 1 TABLET BY MOUTH EVERY DAY 02/28/18   Emeterio Reeve, DO  lisinopril (PRINIVIL,ZESTRIL) 2.5 MG tablet TAKE 1 TABLET BY MOUTH EVERY DAY 02/28/18   Emeterio Reeve, DO  omeprazole (PRILOSEC) 40 MG capsule TAKE 1 CAPSULE (40 MG TOTAL) BY MOUTH DAILY. 01/30/18   Emeterio Reeve, DO  traZODone (DESYREL) 100 MG tablet TAKE 1 TABLET BY MOUTH EVERYDAY AT BEDTIME 01/26/18   Emeterio Reeve, DO    Family History Family History  Problem Relation Age of Onset  . Heart disease Mother   . Heart failure Mother   . Heart disease Father   . Heart attack Father     Social History Social History   Tobacco Use  . Smoking status: Never Smoker  . Smokeless tobacco: Never Used  Substance Use Topics  . Alcohol use: No  . Drug use: No     Allergies   Codeine   Review of Systems Review of Systems  Constitutional: Positive for appetite change. Negative for chills and fever.  HENT: Negative for ear pain and sore throat.   Eyes: Negative for pain and visual disturbance.  Respiratory: Positive for chest tightness and shortness of breath. Negative for cough, wheezing and stridor.    Cardiovascular: Negative for chest pain, palpitations and leg swelling.  Gastrointestinal: Negative.  Negative for abdominal pain and vomiting.  Genitourinary: Negative for dysuria and hematuria.  Musculoskeletal: Positive for gait problem. Negative for arthralgias, back pain, myalgias, neck pain and neck stiffness.  Skin: Negative for color change and rash.  Neurological: Positive for weakness. Negative for dizziness, seizures, syncope, light-headedness and numbness.  Psychiatric/Behavioral: Positive for decreased concentration. Negative for agitation, behavioral problems and sleep disturbance.  All other systems reviewed and are negative.    Physical Exam Updated Vital Signs BP 121/79 (BP Location: Right Arm)   Pulse  72   Temp 98.3 F (36.8 C) (Oral)   Resp (!) 22   Ht 5' (1.524 m)   Wt 73 kg   SpO2 97%   BMI 31.44 kg/m   Physical Exam   ED Treatments / Results  Labs (all labs ordered are listed, but only abnormal results are displayed) Labs Reviewed  COMPREHENSIVE METABOLIC PANEL - Abnormal; Notable for the following components:      Result Value   Sodium 126 (*)    CO2 17 (*)    Glucose, Bld 114 (*)    Creatinine, Ser 1.40 (*)    Albumin 3.4 (*)    AST 56 (*)    ALT 74 (*)    GFR calc non Af Amer 33 (*)    GFR calc Af Amer 38 (*)    All other components within normal limits  CBC - Abnormal; Notable for the following components:   Hemoglobin 11.5 (*)    HCT 35.0 (*)    All other components within normal limits  URINALYSIS, ROUTINE W REFLEX MICROSCOPIC - Abnormal; Notable for the following components:   APPearance HAZY (*)    Specific Gravity, Urine 1.004 (*)    Leukocytes,Ua SMALL (*)    Bacteria, UA MANY (*)    All other components within normal limits  BRAIN NATRIURETIC PEPTIDE - Abnormal; Notable for the following components:   B Natriuretic Peptide 2,566.1 (*)    All other components within normal limits  URINE CULTURE  BASIC METABOLIC PANEL  SODIUM,  URINE, RANDOM  I-STAT TROPONIN, ED    EKG EKG Interpretation  Date/Time:  Tuesday March 21 2018 15:36:32 EST Ventricular Rate:  80 PR Interval:  202 QRS Duration: 138 QT Interval:  430 QTC Calculation: 495 R Axis:   -76 Text Interpretation:  Sinus rhythm with Premature atrial complexes Left axis deviation Non-specific intra-ventricular conduction block Nonspecific T wave abnormality Abnormal ECG Confirmed by Lennice Sites [phone]) on 03/21/2018 9:10:16 PM   Radiology Dg Chest 2 View  Result Date: 03/21/2018 CLINICAL DATA:  Struck chronic sternal pain.  Altered mental status. EXAM: CHEST - 2 VIEW COMPARISON:  Chest radiograph June 5th 2017 FINDINGS: Cardiac silhouette is moderately enlarged. Calcified aortic arch. Retrocardiac strandy densities with blunting of the RIGHT costophrenic angle. No pneumothorax. Soft tissue planes and included osseous structures are nonacute. Surgical clips in the included right abdomen compatible with cholecystectomy. IMPRESSION: 1. Cardiomegaly. 2. LEFT lung base atelectasis/scarring. Small RIGHT pleural effusion versus pleural thickening. 3.  Aortic Atherosclerosis (ICD10-I70.0). Electronically Signed   By: Elon Alas M.D.   On: 03/21/2018 22:37   Ct Head Wo Contrast  Result Date: 03/21/2018 CLINICAL DATA:  Altered mental status EXAM: CT HEAD WITHOUT CONTRAST TECHNIQUE: Contiguous axial images were obtained from the base of the skull through the vertex without intravenous contrast. COMPARISON:  None. FINDINGS: Brain: Age related involutional changes of the brain with mild-to-moderate small vessel ischemic disease of periventricular and subcortical white matter. No acute intracranial hemorrhage, mass midline shift or edema. No extra-axial fluid collections. No hydrocephalus. Midline fourth ventricle and basal cisterns. The brainstem and cerebellum are nonacute. Vascular: Atherosclerosis at the skull base. No hyperdense vessel sign. Skull: Intact without acute  fracture or suspicious osseous lesions. Sinuses/Orbits: Right cataract extraction. Intact orbits and globes. Mild ethmoid sinus mucosal thickening. The frontal, sphenoid and included maxillary sinuses are clear. The mastoids are clear. Other: No significant calvarial soft tissue swelling. IMPRESSION: 1. Involutional changes of the brain consistent with age. 2. Chronic  appearing microvascular ischemic disease of periventricular and subcortical white matter. 3. No acute appearing intracranial abnormality. Electronically Signed   By: Ashley Royalty M.D.   On: 03/21/2018 21:32    Procedures Procedures (including critical care time)  Medications Ordered in ED Medications  sodium chloride flush (NS) 0.9 % injection 3 mL (has no administration in time range)  cefTRIAXone (ROCEPHIN) 1 g in sodium chloride 0.9 % 100 mL IVPB (1 g Intravenous New Bag/Given 03/21/18 2321)  0.9 %  sodium chloride infusion (has no administration in time range)     Initial Impression / Assessment and Plan / ED Course  I have reviewed the triage vital signs and the nursing notes.  Pertinent labs & imaging results that were available during my care of the patient were reviewed by me and considered in my medical decision making (see chart for details).  Clinical Course as of Mar 21 2342  Tue Mar 21, 2018  2341 Patient to be admitted for hyponatremia, AMS. Consulted with cardiology who report nothing to due currently from cardiac standpoint but will be happy to consult on the patient in the future if any changes.     [KM]    Clinical Course User Index [KM] Alveria Apley, PA-C       CRITICAL CARE Performed by: Alveria Apley   Total critical care time: 45 minutes  Critical care time was exclusive of separately billable procedures and treating other patients.  Critical care was necessary to treat or prevent imminent or life-threatening deterioration.  Critical care was time spent personally by me on the following  activities: development of treatment plan with patient and/or surrogate as well as nursing, discussions with consultants, evaluation of patient's response to treatment, examination of patient, obtaining history from patient or surrogate, ordering and performing treatments and interventions, ordering and review of laboratory studies, ordering and review of radiographic studies, pulse oximetry and re-evaluation of patient's condition.   Final Clinical Impressions(s) / ED Diagnoses   Final diagnoses:  Altered mental status, unspecified altered mental status type  Hyponatremia    ED Discharge Orders    None       Kristine Royal 03/21/18 2344    Lennice Sites, DO 03/22/18 [phone]

## 2018-03-21 NOTE — ED Notes (Signed)
Patient transported to X-ray 

## 2018-03-22 ENCOUNTER — Encounter (HOSPITAL_COMMUNITY): Payer: Self-pay | Admitting: *Deleted

## 2018-03-22 DIAGNOSIS — R1310 Dysphagia, unspecified: Secondary | ICD-10-CM

## 2018-03-22 DIAGNOSIS — E871 Hypo-osmolality and hyponatremia: Secondary | ICD-10-CM | POA: Diagnosis present

## 2018-03-22 DIAGNOSIS — F0280 Dementia in other diseases classified elsewhere without behavioral disturbance: Secondary | ICD-10-CM

## 2018-03-22 DIAGNOSIS — Z66 Do not resuscitate: Secondary | ICD-10-CM

## 2018-03-22 DIAGNOSIS — I10 Essential (primary) hypertension: Secondary | ICD-10-CM

## 2018-03-22 DIAGNOSIS — R4182 Altered mental status, unspecified: Secondary | ICD-10-CM

## 2018-03-22 DIAGNOSIS — I5042 Chronic combined systolic (congestive) and diastolic (congestive) heart failure: Secondary | ICD-10-CM

## 2018-03-22 DIAGNOSIS — N184 Chronic kidney disease, stage 4 (severe): Secondary | ICD-10-CM

## 2018-03-22 DIAGNOSIS — G309 Alzheimer's disease, unspecified: Secondary | ICD-10-CM

## 2018-03-22 LAB — BASIC METABOLIC PANEL
Anion gap: 9 (ref 5–15)
BUN: 11 mg/dL (ref 8–23)
CO2: 19 mmol/L — ABNORMAL LOW (ref 22–32)
Calcium: 8.8 mg/dL — ABNORMAL LOW (ref 8.9–10.3)
Chloride: 104 mmol/L (ref 98–111)
Creatinine, Ser: 1.44 mg/dL — ABNORMAL HIGH (ref 0.44–1.00)
GFR calc Af Amer: 37 mL/min — ABNORMAL LOW (ref >60)
GFR calc non Af Amer: 32 mL/min — ABNORMAL LOW (ref >60)
Glucose, Bld: 83 mg/dL (ref 70–99)
Potassium: 4.3 mmol/L (ref 3.5–5.1)
Sodium: 132 mmol/L — ABNORMAL LOW (ref 135–145)

## 2018-03-22 LAB — SODIUM, URINE, RANDOM: Sodium, Ur: 17 mmol/L

## 2018-03-22 MED ORDER — ONDANSETRON HCL 4 MG PO TABS: 4.0000 mg | ORAL_TABLET | Freq: Four times a day (QID) | ORAL | Status: DC | PRN

## 2018-03-22 MED ORDER — GATIFLOXACIN 0.5 % OP SOLN
1.0000 [drp] | Freq: Two times a day (BID) | OPHTHALMIC | Status: DC
  Administered 2018-03-22 – 2018-03-24 (×5): 1 [drp] via OPHTHALMIC
  Filled 2018-03-22: qty 2.5

## 2018-03-22 MED ORDER — PANTOPRAZOLE SODIUM 40 MG PO TBEC
80.0000 mg | DELAYED_RELEASE_TABLET | Freq: Every day | ORAL | Status: DC
  Administered 2018-03-22 – 2018-03-24 (×3): 80 mg via ORAL
  Filled 2018-03-22 (×3): qty 2

## 2018-03-22 MED ORDER — ACETAMINOPHEN 325 MG PO TABS: 650.0000 mg | ORAL_TABLET | Freq: Four times a day (QID) | ORAL | Status: DC | PRN

## 2018-03-22 MED ORDER — ASPIRIN EC 81 MG PO TBEC
81.0000 mg | DELAYED_RELEASE_TABLET | Freq: Every day | ORAL | Status: DC
  Administered 2018-03-22 – 2018-03-24 (×3): 81 mg via ORAL
  Filled 2018-03-22 (×3): qty 1

## 2018-03-22 MED ORDER — TRAZODONE HCL 100 MG PO TABS
100.0000 mg | ORAL_TABLET | Freq: Every day | ORAL | Status: DC
  Administered 2018-03-22 – 2018-03-23 (×3): 100 mg via ORAL
  Filled 2018-03-22 (×3): qty 1

## 2018-03-22 MED ORDER — NYSTATIN 100000 UNIT/ML MT SUSP
5.0000 mL | Freq: Four times a day (QID) | OROMUCOSAL | Status: DC
  Administered 2018-03-22 – 2018-03-24 (×11): 500000 [IU] via ORAL
  Filled 2018-03-22 (×11): qty 5

## 2018-03-22 MED ORDER — ONDANSETRON HCL 4 MG/2ML IJ SOLN: 4.0000 mg | Freq: Four times a day (QID) | INTRAMUSCULAR | Status: DC | PRN

## 2018-03-22 MED ORDER — ADULT MULTIVITAMIN W/MINERALS CH
1.0000 | ORAL_TABLET | Freq: Every day | ORAL | Status: DC
  Administered 2018-03-22 – 2018-03-24 (×3): 1 via ORAL
  Filled 2018-03-22 (×3): qty 1

## 2018-03-22 MED ORDER — ATORVASTATIN CALCIUM 10 MG PO TABS
20.0000 mg | ORAL_TABLET | Freq: Every day | ORAL | Status: DC
  Administered 2018-03-22 – 2018-03-24 (×3): 20 mg via ORAL
  Filled 2018-03-22 (×3): qty 2

## 2018-03-22 MED ORDER — ISOSORBIDE MONONITRATE ER 30 MG PO TB24
30.0000 mg | ORAL_TABLET | Freq: Every day | ORAL | Status: DC
  Administered 2018-03-22 – 2018-03-24 (×3): 30 mg via ORAL
  Filled 2018-03-22 (×3): qty 1

## 2018-03-22 MED ORDER — ACETAMINOPHEN 650 MG RE SUPP: 650.0000 mg | Freq: Four times a day (QID) | RECTAL | Status: DC | PRN

## 2018-03-22 MED ORDER — ENOXAPARIN SODIUM 30 MG/0.3ML ~~LOC~~ SOLN
30.0000 mg | Freq: Every day | SUBCUTANEOUS | Status: DC
  Administered 2018-03-22 – 2018-03-24 (×3): 30 mg via SUBCUTANEOUS
  Filled 2018-03-22 (×3): qty 0.3

## 2018-03-22 NOTE — ED Notes (Addendum)
Spoke to MD Alcario Drought regarding pt IV fluid order given pt's BNP result, instructed to still give fluids due to pt likely dehydration.

## 2018-03-22 NOTE — H&P (Signed)
History and Physical    Madeline Atkinson MWU:[id number] DOB: Apr 01, 1924 DOA: 03/21/2018  PCP: Emeterio Reeve, DO  Patient coming from: Home  I have personally briefly reviewed patient's old medical records in [facility]  Chief Complaint: AMS  HPI: Madeline Atkinson is a 85 y.o. female with medical history significant of Alzheimer's dementia, HFrEF with EF 10-15% at baseline, GERD, MI, CKD stage 3-4.  Patient brought in to ED for mental status changes and decreased activity over the past week.    Daughter reports that she lives with her mother and at baseline she is very independent she cooks, she cleans, she walks independently with very little assistance.  Reports that about 1 week ago she had an abrupt change in mental status.  Reports that "she is not herself ".  Reports that she is not very interactive with anything anymore.  She used to be a active member of adult daycare but does not want to participate.  Also had an episode of bowel incontinence today which has never happened before. She has been more confused, complaining of some chest tightness and SOB occasionally, as well as needing lots of assistance while walking.  Patient was seen in urgent care just a few days ago who did UA and EKG.  Unremarkable findings on UA but she had some nonspecific changes in her EKG so she was told to follow-up with cardiology.  Cards saw patient, were concerned she might have had acute cardiac event or CHF exacerbation.  ED Course: Sodium 126, small LE on UA, no nitrites only 6-10 WBCs, EDP gave dose of rocephin for possible UTI.  On further discussion with daughter: 1) Patient has had poor PO intake over the past week 2) patient has had pain / difficulty swallowing solids over the past week, having to drink water to wash down solids which seem to get stuck in throat.   Review of Systems: As per HPI otherwise 10 point review of systems negative.   Past Medical History:  Diagnosis Date  . Acid reflux    . Allergic rhinitis   . Alzheimer disease ([place])   . Anxiety   . Aortic insufficiency    a. mild to moderate by echo 02/2015.  . Back pain   . CAD (coronary artery disease), native coronary artery    NSTEMI 10/2014 - medical management due to frailty.  . Chronic back pain   . Chronic combined systolic and diastolic CHF (congestive heart failure) (HCC)    a. EF 20-25% with severe global LV dysfunction 10/2014. b. repeat echo 02/2015 with EF 72-53%, grade 2 diastolic dysfunction, mild RV dysfunctoin with akinetic RV, severe biatrial enlargement.  . CKD (chronic kidney disease), stage III ([place])   . Cold intolerance   . Depression   . DNR (do not resuscitate)   . Essential hypertension   . GERD (gastroesophageal reflux disease)   . H. pylori infection   . Hyperlipidemia LDL goal <70 03/17/2016  . Mastodynia   . MI, old   . Mild anemia   . OA (osteoarthritis)   . Pain in gums   . Pulmonary hypertension ([place])    a. PASP 10mmHg by echo 02/2015  . Seasonal allergies   . Tremor   . Urinary incontinence   . Vitamin D deficiency     Past Surgical History:  Procedure Laterality Date  . BREAST LUMPECTOMY    . CHOLECYSTECTOMY       reports that she has never smoked. She has never  used smokeless tobacco. She reports that she does not drink alcohol or use drugs.  Allergies  Allergen Reactions  . Codeine Nausea And Vomiting    Family History  Problem Relation Age of Onset  . Heart disease Mother   . Heart failure Mother   . Heart disease Father   . Heart attack Father      Prior to Admission medications   Medication Sig Start Date End Date Taking? Authorizing Provider  acetaminophen (TYLENOL) 500 MG tablet Take 1 tablet (500 mg total) by mouth every 6 (six) hours as needed. 01/26/17   Emeterio Reeve, DO  aspirin 81 MG tablet Take 81 mg by mouth daily.    [provider]  atorvastatin (LIPITOR) 20 MG tablet TAKE 1 TABLET (20 MG TOTAL) BY MOUTH DAILY. 02/08/17    Emeterio Reeve, DO  Cholecalciferol (VITAMIN D PO) Take by mouth. Vitamin D Hydroxy 25 daily    [provider]  clotrimazole (LOTRIMIN) 1 % cream Apply 1 application topically 2 (two) times daily. 06/18/16   Emeterio Reeve, DO  gatifloxacin (ZYMAXID) 0.5 % SOLN Place 1 drop into the right eye 4 (four) times daily. 2 DAYS AFTER EYE INJECTIONS    [provider]  isosorbide mononitrate (IMDUR) 30 MG 24 hr tablet TAKE 1 TABLET BY MOUTH EVERY DAY 02/28/18   Emeterio Reeve, DO  lisinopril (PRINIVIL,ZESTRIL) 2.5 MG tablet TAKE 1 TABLET BY MOUTH EVERY DAY 02/28/18   Emeterio Reeve, DO  omeprazole (PRILOSEC) 40 MG capsule TAKE 1 CAPSULE (40 MG TOTAL) BY MOUTH DAILY. 01/30/18   Emeterio Reeve, DO  traZODone (DESYREL) 100 MG tablet TAKE 1 TABLET BY MOUTH EVERYDAY AT BEDTIME 01/26/18   Emeterio Reeve, DO    Physical Exam: Vitals:   03/21/18 2119 03/21/18 2130 03/21/18 2230 03/21/18 2330  BP:  121/79 132/72 128/80  Pulse:  72 71   Resp:  (!) 22 15 (!) 26  Temp:  98.3 F (36.8 C)    TempSrc:  Oral    SpO2:  97% 100%   Weight: 73 kg     Height: 5' (1.524 m)       Constitutional: NAD, calm, comfortable Eyes: PERRL, lids and conjunctivae normal ENMT: Mucous membranes are moist. Posterior pharynx clear of any exudate or lesions.Normal dentition.  Neck: normal, supple, no masses, no thyromegaly Respiratory: clear to auscultation bilaterally, no wheezing, no crackles. Normal respiratory effort. No accessory muscle use.  Cardiovascular: Regular rate and rhythm, no murmurs / rubs / gallops. No extremity edema. 2+ pedal pulses. No carotid bruits.  Abdomen: no tenderness, no masses palpated. No hepatosplenomegaly. Bowel sounds positive.  Musculoskeletal: no clubbing / cyanosis. No joint deformity upper and lower extremities. Good ROM, no contractures. Normal muscle tone.  Skin: no rashes, lesions, ulcers. No induration Neurologic: CN 2-12 grossly intact. Sensation  intact, DTR normal. Strength 5/5 in all 4.  Psychiatric: Oriented to self, location, not time   Labs on Admission: I have personally reviewed following labs and imaging studies  CBC: Recent Labs  Lab 03/21/18 1548  WBC 5.0  HGB 11.5*  HCT 35.0*  MCV 84.3  PLT 161   Basic Metabolic Panel: Recent Labs  Lab 03/21/18 1548  NA 126*  K 4.7  CL 98  CO2 17*  GLUCOSE 114*  BUN 12  CREATININE 1.40*  CALCIUM 9.1   GFR: Estimated Creatinine Clearance: 23.3 mL/min (A) (by C-G formula based on SCr of 1.4 mg/dL (H)). Liver Function Tests: Recent Labs  Lab 03/21/18 1548  AST 56*  ALT 74*  ALKPHOS 88  BILITOT 0.7  PROT 7.0  ALBUMIN 3.4*   No results for input(s): LIPASE, AMYLASE in the last 168 hours. No results for input(s): AMMONIA in the last 168 hours. Coagulation Profile: No results for input(s): INR, PROTIME in the last 168 hours. Cardiac Enzymes: No results for input(s): CKTOTAL, CKMB, CKMBINDEX, TROPONINI in the last 168 hours. BNP (last 3 results) No results for input(s): PROBNP in the last 8760 hours. HbA1C: No results for input(s): HGBA1C in the last 72 hours. CBG: No results for input(s): GLUCAP in the last 168 hours. Lipid Profile: No results for input(s): CHOL, HDL, LDLCALC, TRIG, CHOLHDL, LDLDIRECT in the last 72 hours. Thyroid Function Tests: No results for input(s): TSH, T4TOTAL, FREET4, T3FREE, THYROIDAB in the last 72 hours. Anemia Panel: No results for input(s): VITAMINB12, FOLATE, FERRITIN, TIBC, IRON, RETICCTPCT in the last 72 hours. Urine analysis:    Component Value Date/Time   COLORURINE YELLOW 03/21/2018 1551   APPEARANCEUR HAZY (A) 03/21/2018 1551   LABSPEC 1.004 (L) 03/21/2018 1551   PHURINE 6.0 03/21/2018 1551   GLUCOSEU NEGATIVE 03/21/2018 1551   HGBUR NEGATIVE 03/21/2018 1551   BILIRUBINUR NEGATIVE 03/21/2018 1551   BILIRUBINUR negative 03/03/2017 0849   KETONESUR NEGATIVE 03/21/2018 1551   PROTEINUR NEGATIVE 03/21/2018 1551    UROBILINOGEN 0.2 03/16/2018 1535   NITRITE NEGATIVE 03/21/2018 1551   LEUKOCYTESUR SMALL (A) 03/21/2018 1551    Radiological Exams on Admission: Dg Chest 2 View  Result Date: 03/21/2018 CLINICAL DATA:  Struck chronic sternal pain.  Altered mental status. EXAM: CHEST - 2 VIEW COMPARISON:  Chest radiograph June 5th 2017 FINDINGS: Cardiac silhouette is moderately enlarged. Calcified aortic arch. Retrocardiac strandy densities with blunting of the RIGHT costophrenic angle. No pneumothorax. Soft tissue planes and included osseous structures are nonacute. Surgical clips in the included right abdomen compatible with cholecystectomy. IMPRESSION: 1. Cardiomegaly. 2. LEFT lung base atelectasis/scarring. Small RIGHT pleural effusion versus pleural thickening. 3.  Aortic Atherosclerosis (ICD10-I70.0). Electronically Signed   By: Elon Alas M.D.   On: 03/21/2018 22:37   Ct Head Wo Contrast  Result Date: 03/21/2018 CLINICAL DATA:  Altered mental status EXAM: CT HEAD WITHOUT CONTRAST TECHNIQUE: Contiguous axial images were obtained from the base of the skull through the vertex without intravenous contrast. COMPARISON:  None. FINDINGS: Brain: Age related involutional changes of the brain with mild-to-moderate small vessel ischemic disease of periventricular and subcortical white matter. No acute intracranial hemorrhage, mass midline shift or edema. No extra-axial fluid collections. No hydrocephalus. Midline fourth ventricle and basal cisterns. The brainstem and cerebellum are nonacute. Vascular: Atherosclerosis at the skull base. No hyperdense vessel sign. Skull: Intact without acute fracture or suspicious osseous lesions. Sinuses/Orbits: Right cataract extraction. Intact orbits and globes. Mild ethmoid sinus mucosal thickening. The frontal, sphenoid and included maxillary sinuses are clear. The mastoids are clear. Other: No significant calvarial soft tissue swelling. IMPRESSION: 1. Involutional changes of the  brain consistent with age. 2. Chronic appearing microvascular ischemic disease of periventricular and subcortical white matter. 3. No acute appearing intracranial abnormality. Electronically Signed   By: Ashley Royalty M.D.   On: 03/21/2018 21:32    EKG: Independently reviewed.  Assessment/Plan Principal Problem:   Hyponatremia Active Problems:   Chronic combined systolic and diastolic heart failure (HCC)   Alzheimer disease (HCC)   Benign essential HTN   DNR (do not resuscitate)   CKD (chronic kidney disease) stage 4, GFR 15-29 ml/min (HCC)   Dysphagia  1. Hyponatremia - 1. Most likely dehydration based on history from daughter 2. NS at 125 cc/hr 3. Repeat BMP in AM 4. Check urine sodium 2. Chronic combined CHF - 1. Holding lisinopril due to Creat 1.4 2. Watch for fluid overload with IVF above 3. Trop neg and EKG unchanged 3. CKD stage 4 - 1. Holding lisinopril 2. Repeat BMP in AM 3. Also keep eye on bicarb, 17 at the moment, dont want to start bicarb yet though. 4. Alzheimer's disease - 1. Increased confusion in past week, could be related to hyponatremia. 2. Will correct hyponatremia and see if confusion improved at all. 3. If still persistent, then would consider MRI brain in AM, not presenting with classical stroke symptoms though. 5. Dysphagia - 1. Difficulty / pain swallowing solids 2. Will put on clear liquid diet for the moment 3. Call GI in AM  DVT prophylaxis: Lovenox Code Status: DNR - confirmed with daughter Family Communication: Daughter at bedside Disposition Plan: TBD Consults called: None Admission status: Place in 10     , [facility]  How to contact the [facility] Attending or Consulting provider [place] or covering provider during after hours [place], for this patient?  1. Check the care team in [facility] and look for a) attending/consulting [facility] provider listed and b) the [facility] team listed 2. Log into [email or web address]  Amion Physician  Scheduling and messaging for groups and whole hospitals  On call and physician scheduling software for group practices, residents, hospitalists and other medical providers for call, clinic, rotation and shift schedules. On[employer] is a hospital-wide system for scheduling doctors and paging doctors on call. EasyPlot is for scientific plotting and data analysis.  [email or web address]  and use [place]'s universal password to access. If you do not have the password, please contact the hospital operator.  3. Locate the [facility] provider you are looking for under [facility] and page to a number that you can be directly reached. 4. If you still have difficulty reaching the provider, please page the [facility] (Director on Call) for the Hospitalists listed on amion for assistance.  03/22/2018, 12:04 AM

## 2018-03-22 NOTE — ED Notes (Signed)
ED TO INPATIENT HANDOFF REPORT  ED Nurse Name and Phone #:  [place] [phone]  S Name/Age/Gender Revonda Humphrey 85 y.o. female Room/Bed: 030C/030C  Code Status   Code Status: DNR  Home/SNF/Other Home Pt A+O to self and situation, disoriented to time and place Is this baseline? YES Triage Complete: Triage complete  Chief Complaint Abnormal ECG  Triage Note Pt here with daughter for AMS, daughter states the pt has not been herself since last week. Pt not talking as much, has been sleeping more and has not been eating much either. Pt alert and oriented to self, nad noted   Allergies Allergies  Allergen Reactions  . Codeine Nausea And Vomiting    Level of Care/Admitting Diagnosis ED Disposition    ED Disposition Condition Comment   Admit  Hospital Area: [place] [[id number]]  Level of Care: Med-Surg [16]  I expect the patient will be discharged within 24 hours: No (not a candidate for 5C-Observation unit)  Diagnosis: Hyponatremia [277824]  Admitting Physician: Etta Quill [[id number]]  Attending Physician: Etta Quill [[id number]]  PT Class (Do Not Modify): Observation [104]  PT Acc Code (Do Not Modify): Observation [10022]       B Medical/Surgery History Past Medical History:  Diagnosis Date  . Acid reflux   . Allergic rhinitis   . Alzheimer disease ([place])   . Anxiety   . Aortic insufficiency    a. mild to moderate by echo 02/2015.  . Back pain   . CAD (coronary artery disease), native coronary artery    NSTEMI 10/2014 - medical management due to frailty.  . Chronic back pain   . Chronic combined systolic and diastolic CHF (congestive heart failure) (HCC)    a. EF 20-25% with severe global LV dysfunction 10/2014. b. repeat echo 02/2015 with EF 23-53%, grade 2 diastolic dysfunction, mild RV dysfunctoin with akinetic RV, severe biatrial enlargement.  . CKD (chronic kidney disease), stage III ([place])   . Cold intolerance   . Depression   . DNR (do not  resuscitate)   . Essential hypertension   . GERD (gastroesophageal reflux disease)   . H. pylori infection   . Hyperlipidemia LDL goal <70 03/17/2016  . Mastodynia   . MI, old   . Mild anemia   . OA (osteoarthritis)   . Pain in gums   . Pulmonary hypertension ([place])    a. PASP 53mmHg by echo 02/2015  . Seasonal allergies   . Tremor   . Urinary incontinence   . Vitamin D deficiency    Past Surgical History:  Procedure Laterality Date  . BREAST LUMPECTOMY    . CHOLECYSTECTOMY       A IV Location/Drains/Wounds Patient Lines/Drains/Airways Status   Active Line/Drains/Airways    Name:   Placement date:   Placement time:   Site:   Days:   Peripheral IV 03/21/18 Right Forearm   03/21/18    2221    Forearm   1          Intake/Output Last 24 hours No intake or output data in the 24 hours ending 03/22/18 0023  Labs/Imaging Results for orders placed or performed during the hospital encounter of 03/21/18 (from the past 48 hour(s))  Comprehensive metabolic panel     Status: Abnormal   Collection Time: 03/21/18  3:48 PM  Result Value Ref Range   Sodium 126 (L) 135 - 145 mmol/L   Potassium 4.7 3.5 - 5.1 mmol/L   Chloride 98 98 -  111 mmol/L   CO2 17 (L) 22 - 32 mmol/L   Glucose, Bld 114 (H) 70 - 99 mg/dL   BUN 12 8 - 23 mg/dL   Creatinine, Ser 1.40 (H) 0.44 - 1.00 mg/dL   Calcium 9.1 8.9 - 10.3 mg/dL   Total Protein 7.0 6.5 - 8.1 g/dL   Albumin 3.4 (L) 3.5 - 5.0 g/dL   AST 56 (H) 15 - 41 U/L   ALT 74 (H) 0 - 44 U/L   Alkaline Phosphatase 88 38 - 126 U/L   Total Bilirubin 0.7 0.3 - 1.2 mg/dL   GFR calc non Af Amer 33 (L) >60 mL/min   GFR calc Af Amer 38 (L) >60 mL/min   Anion gap 11 5 - 15    Comment: Performed at [facility] [address], [place], [place] [address]  CBC     Status: Abnormal   Collection Time: 03/21/18  3:48 PM  Result Value Ref Range   WBC 5.0 4.0 - 10.5 K/uL   RBC 4.15 3.87 - 5.11 MIL/uL   Hemoglobin 11.5 (L) 12.0 - 15.0 g/dL   HCT 35.0 (L)  36.0 - 46.0 %   MCV 84.3 80.0 - 100.0 fL   MCH 27.7 26.0 - 34.0 pg   MCHC 32.9 30.0 - 36.0 g/dL   RDW 15.2 11.5 - 15.5 %   Platelets 174 150 - 400 K/uL   nRBC 0.0 0.0 - 0.2 %    Comment: Performed at [facility], [place] [address], [place], [place] [address]  Urinalysis, Routine w reflex microscopic     Status: Abnormal   Collection Time: 03/21/18  3:51 PM  Result Value Ref Range   Color, Urine YELLOW YELLOW   APPearance HAZY (A) CLEAR   Specific Gravity, Urine 1.004 (L) 1.005 - 1.030   pH 6.0 5.0 - 8.0   Glucose, UA NEGATIVE NEGATIVE mg/dL   Hgb urine dipstick NEGATIVE NEGATIVE   Bilirubin Urine NEGATIVE NEGATIVE   Ketones, ur NEGATIVE NEGATIVE mg/dL   Protein, ur NEGATIVE NEGATIVE mg/dL   Nitrite NEGATIVE NEGATIVE   Leukocytes,Ua SMALL (A) NEGATIVE   RBC / HPF 0-5 0 - 5 RBC/hpf   WBC, UA 6-10 0 - 5 WBC/hpf   Bacteria, UA MANY (A) NONE SEEN   Squamous Epithelial / LPF 0-5 0 - 5    Comment: Performed at [facility], [address] [place], [place] [address]  Brain natriuretic peptide     Status: Abnormal   Collection Time: 03/21/18 10:18 PM  Result Value Ref Range   B Natriuretic Peptide 2,566.1 (H) 0.0 - 100.0 pg/mL    Comment: Performed at [facility] [address] [place], [place] [address]  I-Stat Troponin, ED (not at [facility])     Status: None   Collection Time: 03/21/18 10:32 PM  Result Value Ref Range   Troponin i, poc 0.03 0.00 - 0.08 ng/mL   Comment 3            Comment: Due to the release kinetics of cTnI, a negative result within the first hours of the onset of symptoms does not rule out myocardial infarction with certainty. If myocardial infarction is still suspected, repeat the test at appropriate intervals.    Dg Chest 2 View  Result Date: 03/21/2018 CLINICAL DATA:  Struck chronic sternal pain.  Altered mental status. EXAM: CHEST - 2 VIEW COMPARISON:  Chest radiograph June 5th 2017 FINDINGS: Cardiac silhouette is moderately  enlarged.  Calcified aortic arch. Retrocardiac strandy densities with blunting of the RIGHT costophrenic angle. No pneumothorax. Soft tissue planes and included osseous structures are nonacute. Surgical clips in the included right abdomen compatible with cholecystectomy. IMPRESSION: 1. Cardiomegaly. 2. LEFT lung base atelectasis/scarring. Small RIGHT pleural effusion versus pleural thickening. 3.  Aortic Atherosclerosis (ICD10-I70.0). Electronically Signed   By: Elon Alas M.D.   On: 03/21/2018 22:37   Ct Head Wo Contrast  Result Date: 03/21/2018 CLINICAL DATA:  Altered mental status EXAM: CT HEAD WITHOUT CONTRAST TECHNIQUE: Contiguous axial images were obtained from the base of the skull through the vertex without intravenous contrast. COMPARISON:  None. FINDINGS: Brain: Age related involutional changes of the brain with mild-to-moderate small vessel ischemic disease of periventricular and subcortical white matter. No acute intracranial hemorrhage, mass midline shift or edema. No extra-axial fluid collections. No hydrocephalus. Midline fourth ventricle and basal cisterns. The brainstem and cerebellum are nonacute. Vascular: Atherosclerosis at the skull base. No hyperdense vessel sign. Skull: Intact without acute fracture or suspicious osseous lesions. Sinuses/Orbits: Right cataract extraction. Intact orbits and globes. Mild ethmoid sinus mucosal thickening. The frontal, sphenoid and included maxillary sinuses are clear. The mastoids are clear. Other: No significant calvarial soft tissue swelling. IMPRESSION: 1. Involutional changes of the brain consistent with age. 2. Chronic appearing microvascular ischemic disease of periventricular and subcortical white matter. 3. No acute appearing intracranial abnormality. Electronically Signed   By: Ashley Royalty M.D.   On: 03/21/2018 21:32    Pending Labs Unresulted Labs (From admission, onward)    Start     Ordered   03/22/18 0174  Basic metabolic panel  Tomorrow  morning,   R     03/21/18 2342   03/21/18 2343  Sodium, urine, random  Once,   R     03/21/18 2342   03/21/18 2106  Urine culture  ONCE - STAT,   STAT     03/21/18 2105          Vitals/Pain Today's Vitals   03/21/18 2119 03/21/18 2130 03/21/18 2230 03/21/18 2330  BP:  121/79 132/72 128/80  Pulse:  72 71   Resp:  (!) 22 15 (!) 26  Temp:  98.3 F (36.8 C)    TempSrc:  Oral    SpO2:  97% 100%   Weight: 73 kg     Height: 5' (1.524 m)     PainSc:        Isolation Precautions No active isolations  Medications Medications  sodium chloride flush (NS) 0.9 % injection 3 mL (has no administration in time range)  0.9 %  sodium chloride infusion (has no administration in time range)  acetaminophen (TYLENOL) tablet 650 mg (has no administration in time range)    Or  acetaminophen (TYLENOL) suppository 650 mg (has no administration in time range)  ondansetron (ZOFRAN) tablet 4 mg (has no administration in time range)    Or  ondansetron (ZOFRAN) injection 4 mg (has no administration in time range)  enoxaparin (LOVENOX) injection 30 mg (has no administration in time range)  aspirin EC tablet 81 mg (has no administration in time range)  atorvastatin (LIPITOR) tablet 20 mg (has no administration in time range)  isosorbide mononitrate (IMDUR) 24 hr tablet 30 mg (has no administration in time range)  pantoprazole (PROTONIX) EC tablet 80 mg (has no administration in time range)  traZODone (DESYREL) tablet 100 mg (has no administration in time range)  cefTRIAXone (ROCEPHIN) 1 g in sodium chloride 0.9 % 100  mL IVPB (0 g Intravenous Stopped 03/22/18 0012)    Mobility walks with person assist High fall risk   Focused Assessments Cardiac Assessment Handoff:  Cardiac Rhythm: Normal sinus rhythm Lab Results  Component Value Date   TROPONINI 0.03 06/23/2015   No results found for: DDIMER Does the Patient currently have chest pain? No  , Neuro Assessment Handoff:  Swallow screen pass?  N/A Cardiac Rhythm: Normal sinus rhythm       Neuro Assessment: Exceptions to WDL Neuro Checks:      Last Documented NIHSS Modified Score:   Has TPA been given? No  If patient is a Neuro Trauma and patient is going to OR before floor call report to [facility] nurse: [phone] or [phone]  , Pulmonary Assessment Handoff:  Lung sounds:   O2 Device: Room Air        R Recommendations: See Admitting Provider Note  Report given to:   Additional Notes: Pt w/ hx of dementia, attempted to use purewick but pt unable to use. No skin issues observed.

## 2018-03-22 NOTE — Care Management Obs Status (Signed)
[place] NOTIFICATION   Patient Details  Name: Madeline Atkinson MRN: [id number] Date of Birth: Apr 22, 1924   Medicare Observation Status Notification Given:  Yes    Bartholomew Crews, RN 03/22/2018, 3:36 PM

## 2018-03-22 NOTE — ED Notes (Addendum)
3W RN called to request that ED RN contact inpatient provider to have cardiac monitor order discontinued since pt's level of care is medsurg. 3W RN states that the unit does have tele boxes to place the patient on if status is changed to tele but that tele boxes are only for stroke patients. Inpatient provider contacted and CM order discontinued. Pt will be transported to the floor.

## 2018-03-22 NOTE — Progress Notes (Signed)
Patient admitted after midnight, please see H&P.  Here with a component of failure to thrive.  Appears to have thrush.  Symptoms improved with hydration. Continue to monitor for symptom resolution.  Advance diet as tolerated.  Eulogio Bear DO

## 2018-03-22 NOTE — Care Management Note (Signed)
Case Management Note Manya Silvas, RN MSN CCM Transitions of Care 24M [place] [phone]  Patient Details  Name: Madeline Atkinson MRN: [id number] Date of Birth: 05-Nov-1924  Subjective/Objective:      Hyponatremia            Action/Plan: Spoke with daughter, Midge Minium [facility]), at bedside. [name] provided copy of HCPOA-copy made and placed in shadow chart. Patient attends a memory day care during the week while [name] works. [name] states that there is a Production assistant, radio to assist with caregiving needs. [name] states that her job is flexible allowing her to be able to care for patient as needed. While discussing caregiving resources in the home, [name] stated that she is aware of palliative care and hospice services, but currently she has been able to provide care to patient. CM to follow for transition of care needs.   Expected Discharge Date:                  Expected Discharge Plan:  Home/Self Care(daughter is caregiver)  In-House Referral:     Discharge planning Services  CM Consult  Post Acute Care Choice:    Choice offered to:     DME Arranged:    DME Agency:     HH Arranged:    HH Agency:     Status of Service:  In process, will continue to follow  If discussed at Long Length of Stay Meetings, dates discussed:    Additional Comments:  Bartholomew Crews, RN 03/22/2018, 3:39 PM

## 2018-03-23 ENCOUNTER — Observation Stay (HOSPITAL_COMMUNITY): Payer: Medicare HMO

## 2018-03-23 DIAGNOSIS — I10 Essential (primary) hypertension: Secondary | ICD-10-CM | POA: Diagnosis not present

## 2018-03-23 DIAGNOSIS — E785 Hyperlipidemia, unspecified: Secondary | ICD-10-CM | POA: Diagnosis present

## 2018-03-23 DIAGNOSIS — I5042 Chronic combined systolic (congestive) and diastolic (congestive) heart failure: Secondary | ICD-10-CM | POA: Diagnosis not present

## 2018-03-23 DIAGNOSIS — I351 Nonrheumatic aortic (valve) insufficiency: Secondary | ICD-10-CM | POA: Diagnosis present

## 2018-03-23 DIAGNOSIS — I130 Hypertensive heart and chronic kidney disease with heart failure and stage 1 through stage 4 chronic kidney disease, or unspecified chronic kidney disease: Secondary | ICD-10-CM | POA: Diagnosis present

## 2018-03-23 DIAGNOSIS — G309 Alzheimer's disease, unspecified: Secondary | ICD-10-CM | POA: Diagnosis present

## 2018-03-23 DIAGNOSIS — B370 Candidal stomatitis: Secondary | ICD-10-CM

## 2018-03-23 DIAGNOSIS — G9341 Metabolic encephalopathy: Secondary | ICD-10-CM | POA: Diagnosis not present

## 2018-03-23 DIAGNOSIS — E860 Dehydration: Secondary | ICD-10-CM | POA: Diagnosis present

## 2018-03-23 DIAGNOSIS — R05 Cough: Secondary | ICD-10-CM

## 2018-03-23 DIAGNOSIS — I252 Old myocardial infarction: Secondary | ICD-10-CM | POA: Diagnosis not present

## 2018-03-23 DIAGNOSIS — Z9049 Acquired absence of other specified parts of digestive tract: Secondary | ICD-10-CM | POA: Diagnosis not present

## 2018-03-23 DIAGNOSIS — I5043 Acute on chronic combined systolic (congestive) and diastolic (congestive) heart failure: Secondary | ICD-10-CM | POA: Diagnosis present

## 2018-03-23 DIAGNOSIS — F0280 Dementia in other diseases classified elsewhere without behavioral disturbance: Secondary | ICD-10-CM | POA: Diagnosis present

## 2018-03-23 DIAGNOSIS — J90 Pleural effusion, not elsewhere classified: Secondary | ICD-10-CM | POA: Diagnosis not present

## 2018-03-23 DIAGNOSIS — I2510 Atherosclerotic heart disease of native coronary artery without angina pectoris: Secondary | ICD-10-CM | POA: Diagnosis present

## 2018-03-23 DIAGNOSIS — B3781 Candidal esophagitis: Secondary | ICD-10-CM

## 2018-03-23 DIAGNOSIS — J302 Other seasonal allergic rhinitis: Secondary | ICD-10-CM | POA: Diagnosis present

## 2018-03-23 DIAGNOSIS — E871 Hypo-osmolality and hyponatremia: Secondary | ICD-10-CM | POA: Diagnosis present

## 2018-03-23 DIAGNOSIS — N183 Chronic kidney disease, stage 3 (moderate): Secondary | ICD-10-CM | POA: Diagnosis not present

## 2018-03-23 DIAGNOSIS — F419 Anxiety disorder, unspecified: Secondary | ICD-10-CM | POA: Diagnosis present

## 2018-03-23 DIAGNOSIS — E669 Obesity, unspecified: Secondary | ICD-10-CM | POA: Diagnosis present

## 2018-03-23 DIAGNOSIS — K219 Gastro-esophageal reflux disease without esophagitis: Secondary | ICD-10-CM | POA: Diagnosis present

## 2018-03-23 DIAGNOSIS — R627 Adult failure to thrive: Secondary | ICD-10-CM | POA: Diagnosis present

## 2018-03-23 DIAGNOSIS — I272 Pulmonary hypertension, unspecified: Secondary | ICD-10-CM | POA: Diagnosis present

## 2018-03-23 DIAGNOSIS — G8929 Other chronic pain: Secondary | ICD-10-CM | POA: Diagnosis present

## 2018-03-23 DIAGNOSIS — G9349 Other encephalopathy: Secondary | ICD-10-CM | POA: Diagnosis present

## 2018-03-23 DIAGNOSIS — Z66 Do not resuscitate: Secondary | ICD-10-CM | POA: Diagnosis present

## 2018-03-23 DIAGNOSIS — N184 Chronic kidney disease, stage 4 (severe): Secondary | ICD-10-CM | POA: Diagnosis present

## 2018-03-23 DIAGNOSIS — F329 Major depressive disorder, single episode, unspecified: Secondary | ICD-10-CM | POA: Diagnosis present

## 2018-03-23 DIAGNOSIS — R1310 Dysphagia, unspecified: Secondary | ICD-10-CM | POA: Diagnosis present

## 2018-03-23 LAB — BASIC METABOLIC PANEL
Anion gap: 7 (ref 5–15)
BUN: 7 mg/dL — ABNORMAL LOW (ref 8–23)
CO2: 21 mmol/L — ABNORMAL LOW (ref 22–32)
Calcium: 8.6 mg/dL — ABNORMAL LOW (ref 8.9–10.3)
Chloride: 107 mmol/L (ref 98–111)
Creatinine, Ser: 1.29 mg/dL — ABNORMAL HIGH (ref 0.44–1.00)
GFR calc Af Amer: 42 mL/min — ABNORMAL LOW (ref >60)
GFR calc non Af Amer: 36 mL/min — ABNORMAL LOW (ref >60)
GLUCOSE: 98 mg/dL (ref 70–99)
Potassium: 4.2 mmol/L (ref 3.5–5.1)
Sodium: 135 mmol/L (ref 135–145)

## 2018-03-23 LAB — CBC
HCT: 32.9 % — ABNORMAL LOW (ref 36.0–46.0)
Hemoglobin: 10.6 g/dL — ABNORMAL LOW (ref 12.0–15.0)
MCH: 27.1 pg (ref 26.0–34.0)
MCHC: 32.2 g/dL (ref 30.0–36.0)
MCV: 84.1 fL (ref 80.0–100.0)
Platelets: 153 10*3/uL (ref 150–400)
RBC: 3.91 MIL/uL (ref 3.87–5.11)
RDW: 15.7 % — ABNORMAL HIGH (ref 11.5–15.5)
WBC: 4.4 10*3/uL (ref 4.0–10.5)
nRBC: 0 % (ref 0.0–0.2)

## 2018-03-23 LAB — URINE CULTURE

## 2018-03-23 MED ORDER — ALUM & MAG HYDROXIDE-SIMETH 200-200-20 MG/5ML PO SUSP
30.0000 mL | ORAL | Status: DC | PRN
  Administered 2018-03-23 – 2018-03-24 (×3): 30 mL via ORAL
  Filled 2018-03-23 (×3): qty 30

## 2018-03-23 MED ORDER — FUROSEMIDE 20 MG PO TABS
20.0000 mg | ORAL_TABLET | Freq: Once | ORAL | Status: AC
  Administered 2018-03-23: 20 mg via ORAL
  Filled 2018-03-23: qty 1

## 2018-03-23 MED ORDER — FLUCONAZOLE 100MG IVPB
100.0000 mg | INTRAVENOUS | Status: DC
  Administered 2018-03-23 – 2018-03-24 (×2): 100 mg via INTRAVENOUS
  Filled 2018-03-23 (×2): qty 50

## 2018-03-23 NOTE — Progress Notes (Signed)
Progress Note    Madeline Atkinson  ZCH:[id number] DOB: Aug 22, 1924  DOA: 03/21/2018 PCP: Emeterio Reeve, DO    Brief Narrative:    Medical records reviewed and are as summarized below:  Madeline Atkinson is an 85 y.o. female  with medical history significant of Alzheimer's dementia, HFrEF with EF 10-15% at baseline, GERD, MI, CKD stage 3-4. Patient brought in to ED for mental status changes and decreased activity over the past week.    Assessment/Plan:   Principal Problem:   Hyponatremia Active Problems:   Chronic combined systolic and diastolic heart failure (HCC)   Alzheimer disease (HCC)   Benign essential HTN   DNR (do not resuscitate)   CKD (chronic kidney disease) stage 4, GFR 15-29 ml/min (HCC)   Dysphagia  Hyponatremia -  -due to dehydration/poor PO intake -improved with IVF -trend  Acute on Chronic combined CHF - -small dose of lasix today and monitor for symptom improvement -monitor cough  CKD stage 4 - -monitor daily  Alzheimer's disease - -Increased confusion in past week, could be related to hyponatremia. -improved mental status but patient is still weak  odynophagia -Difficulty / pain swallowing solids -appears to have thrush -added diflucan/nystatin-- if not much improved over the next 24 hours, may need GI consult   obesity Body mass index is 30.87 kg/m.   Family Communication/Anticipated D/C date and plan/Code Status   DVT prophylaxis: Lovenox ordered. Code Status: dnr Family Communication: called duaghter Disposition Plan:    Medical Consultants:    None.     Subjective:   Still having pain when swallowing food  Objective:    Vitals:   03/23/18 0008 03/23/18 0350 03/23/18 0733 03/23/18 1126  BP: 105/78 125/74 (!) 130/96 134/85  Pulse: 90 78 91 93  Resp: 18 18 18 17   Temp: 98.9 F (37.2 C) 98.3 F (36.8 C) 98.1 F (36.7 C) 98 F (36.7 C)  TempSrc: Oral Oral Oral Oral  SpO2: 99% 99% 100% 100%  Weight:      Height:         Intake/Output Summary (Last 24 hours) at 03/23/2018 1323 Last data filed at 03/22/2018 2359 Gross per 24 hour  Intake 1937.82 ml  Output 300 ml  Net 1637.82 ml   Filed Weights   03/21/18 2119 03/22/18 0128  Weight: 73 kg 71.7 kg    Exam: Pleasant and cooperative rrr Crackles at b/l bases, no increased work of breathing +BS, soft +LE edema  Data Reviewed:   I have personally reviewed following labs and imaging studies:  Labs: Labs show the following:   Basic Metabolic Panel: Recent Labs  Lab 03/21/18 1548 03/22/18 0517 03/23/18 0527  NA 126* 132* 135  K 4.7 4.3 4.2  CL 98 104 107  CO2 17* 19* 21*  GLUCOSE 114* 83 98  BUN 12 11 7*  CREATININE 1.40* 1.44* 1.29*  CALCIUM 9.1 8.8* 8.6*   GFR Estimated Creatinine Clearance: 25.1 mL/min (A) (by C-G formula based on SCr of 1.29 mg/dL (H)). Liver Function Tests: Recent Labs  Lab 03/21/18 1548  AST 56*  ALT 74*  ALKPHOS 88  BILITOT 0.7  PROT 7.0  ALBUMIN 3.4*   No results for input(s): LIPASE, AMYLASE in the last 168 hours. No results for input(s): AMMONIA in the last 168 hours. Coagulation profile No results for input(s): INR, PROTIME in the last 168 hours.  CBC: Recent Labs  Lab 03/21/18 1548 03/23/18 0527  WBC 5.0 4.4  HGB 11.5* 10.6*  HCT  35.0* 32.9*  MCV 84.3 84.1  PLT 174 153   Cardiac Enzymes: No results for input(s): CKTOTAL, CKMB, CKMBINDEX, TROPONINI in the last 168 hours. BNP (last 3 results) No results for input(s): PROBNP in the last 8760 hours. CBG: No results for input(s): GLUCAP in the last 168 hours. D-Dimer: No results for input(s): DDIMER in the last 72 hours. Hgb A1c: No results for input(s): HGBA1C in the last 72 hours. Lipid Profile: No results for input(s): CHOL, HDL, LDLCALC, TRIG, CHOLHDL, LDLDIRECT in the last 72 hours. Thyroid function studies: No results for input(s): TSH, T4TOTAL, T3FREE, THYROIDAB in the last 72 hours.  Invalid input(s): FREET3 Anemia work  up: No results for input(s): VITAMINB12, FOLATE, FERRITIN, TIBC, IRON, RETICCTPCT in the last 72 hours. Sepsis Labs: Recent Labs  Lab 03/21/18 1548 03/23/18 0527  WBC 5.0 4.4    Microbiology Recent Results (from the past 240 hour(s))  Urine culture     Status: Abnormal   Collection Time: 03/22/18 12:28 AM  Result Value Ref Range Status   Specimen Description URINE, RANDOM  Final   Special Requests   Final    NONE Performed at [facility], [place] [address], [place], [place] [address]    Culture MULTIPLE SPECIES PRESENT, SUGGEST RECOLLECTION (A)  Final   Report Status 03/23/2018 FINAL  Final    Procedures and diagnostic studies:  Dg Chest 2 View  Result Date: 03/23/2018 CLINICAL DATA:  Cough EXAM: CHEST - 2 VIEW COMPARISON:  03/21/2018 FINDINGS: Cardiomegaly with vascular congestion. Increasing bilateral lower lobe atelectasis or infiltrates. Suspect small effusions. No acute bony abnormality. IMPRESSION: Cardiomegaly with vascular congestion and increasing bibasilar atelectasis or infiltrates. Small effusions. Electronically Signed   By: Rolm Baptise M.D.   On: 03/23/2018 09:58   Dg Chest 2 View  Result Date: 03/21/2018 CLINICAL DATA:  Struck chronic sternal pain.  Altered mental status. EXAM: CHEST - 2 VIEW COMPARISON:  Chest radiograph June 5th 2017 FINDINGS: Cardiac silhouette is moderately enlarged. Calcified aortic arch. Retrocardiac strandy densities with blunting of the RIGHT costophrenic angle. No pneumothorax. Soft tissue planes and included osseous structures are nonacute. Surgical clips in the included right abdomen compatible with cholecystectomy. IMPRESSION: 1. Cardiomegaly. 2. LEFT lung base atelectasis/scarring. Small RIGHT pleural effusion versus pleural thickening. 3.  Aortic Atherosclerosis (ICD10-I70.0). Electronically Signed   By: Elon Alas M.D.   On: 03/21/2018 22:37   Ct Head Wo Contrast  Result Date: 03/21/2018 CLINICAL DATA:  Altered mental status  EXAM: CT HEAD WITHOUT CONTRAST TECHNIQUE: Contiguous axial images were obtained from the base of the skull through the vertex without intravenous contrast. COMPARISON:  None. FINDINGS: Brain: Age related involutional changes of the brain with mild-to-moderate small vessel ischemic disease of periventricular and subcortical white matter. No acute intracranial hemorrhage, mass midline shift or edema. No extra-axial fluid collections. No hydrocephalus. Midline fourth ventricle and basal cisterns. The brainstem and cerebellum are nonacute. Vascular: Atherosclerosis at the skull base. No hyperdense vessel sign. Skull: Intact without acute fracture or suspicious osseous lesions. Sinuses/Orbits: Right cataract extraction. Intact orbits and globes. Mild ethmoid sinus mucosal thickening. The frontal, sphenoid and included maxillary sinuses are clear. The mastoids are clear. Other: No significant calvarial soft tissue swelling. IMPRESSION: 1. Involutional changes of the brain consistent with age. 2. Chronic appearing microvascular ischemic disease of periventricular and subcortical white matter. 3. No acute appearing intracranial abnormality. Electronically Signed   By: Ashley Royalty M.D.   On: 03/21/2018 21:32    Medications:   .  aspirin EC  81 mg Oral Daily  . atorvastatin  20 mg Oral Daily  . enoxaparin (LOVENOX) injection  30 mg Subcutaneous Daily  . furosemide  20 mg Oral Once  . gatifloxacin  1 drop Both Eyes BID  . isosorbide mononitrate  30 mg Oral Daily  . multivitamin with minerals  1 tablet Oral Daily  . nystatin  5 mL Oral QID  . pantoprazole  80 mg Oral Daily  . sodium chloride flush  3 mL Intravenous Once  . traZODone  100 mg Oral QHS   Continuous Infusions: . fluconazole (DIFLUCAN) IV       LOS: 0 days   Geradine Girt  [facility]   How to contact the [facility] Attending or Consulting provider [place] or covering provider during after hours [place], for this patient?  1. Check the  care team in [facility] and look for a) attending/consulting [facility] provider listed and b) the [facility] team listed 2. Log into [email or web address] and use [place] [facility]'s universal password to access. If you do not have the password, please contact the hospital operator. 3. Locate the [facility] provider you are looking for under [facility] and page to a number that you can be directly reached. 4. If you still have difficulty reaching the provider, please page the [facility] (Director on Call) for the Hospitalists listed on amion for assistance.  03/23/2018, 1:23 PM

## 2018-03-23 NOTE — Progress Notes (Signed)
At bedside to attempt IV restart per consult, patient is out of bed in chair between the wall and bed.  Spoke with Merleen Nicely, RN regarding getting patient back in bed.  Merleen Nicely agreed that she will re-enter consult once patient has returned to bed.  Carolee Rota, RN VAST

## 2018-03-24 DIAGNOSIS — N183 Chronic kidney disease, stage 3 (moderate): Secondary | ICD-10-CM

## 2018-03-24 DIAGNOSIS — G9341 Metabolic encephalopathy: Secondary | ICD-10-CM

## 2018-03-24 LAB — COMPREHENSIVE METABOLIC PANEL
ALT: 47 U/L — AB (ref 0–44)
AST: 35 U/L (ref 15–41)
Albumin: 2.7 g/dL — ABNORMAL LOW (ref 3.5–5.0)
Alkaline Phosphatase: 72 U/L (ref 38–126)
Anion gap: 7 (ref 5–15)
BILIRUBIN TOTAL: 0.8 mg/dL (ref 0.3–1.2)
BUN: 7 mg/dL — ABNORMAL LOW (ref 8–23)
CALCIUM: 8.7 mg/dL — AB (ref 8.9–10.3)
CO2: 22 mmol/L (ref 22–32)
Chloride: 104 mmol/L (ref 98–111)
Creatinine, Ser: 1.3 mg/dL — ABNORMAL HIGH (ref 0.44–1.00)
GFR calc Af Amer: 42 mL/min — ABNORMAL LOW (ref >60)
GFR, EST NON AFRICAN AMERICAN: 36 mL/min — AB (ref >60)
Glucose, Bld: 101 mg/dL — ABNORMAL HIGH (ref 70–99)
Potassium: 4.4 mmol/L (ref 3.5–5.1)
Sodium: 133 mmol/L — ABNORMAL LOW (ref 135–145)
Total Protein: 5.9 g/dL — ABNORMAL LOW (ref 6.5–8.1)

## 2018-03-24 LAB — CBC
HEMATOCRIT: 32.4 % — AB (ref 36.0–46.0)
HEMOGLOBIN: 10.5 g/dL — AB (ref 12.0–15.0)
MCH: 27.1 pg (ref 26.0–34.0)
MCHC: 32.4 g/dL (ref 30.0–36.0)
MCV: 83.7 fL (ref 80.0–100.0)
Platelets: 170 10*3/uL (ref 150–400)
RBC: 3.87 MIL/uL (ref 3.87–5.11)
RDW: 15.7 % — ABNORMAL HIGH (ref 11.5–15.5)
WBC: 6.1 10*3/uL (ref 4.0–10.5)
nRBC: 0 % (ref 0.0–0.2)

## 2018-03-24 MED ORDER — NYSTATIN 100000 UNIT/ML MT SUSP: 5.0000 mL | Freq: Four times a day (QID) | OROMUCOSAL | 0 refills | Status: DC

## 2018-03-24 MED ORDER — ACETAMINOPHEN 500 MG PO TABS: 500.0000 mg | ORAL_TABLET | ORAL | Status: AC

## 2018-03-24 NOTE — Evaluation (Signed)
Physical Therapy Evaluation Patient Details Name: Madeline Atkinson MRN: [id number] DOB: Oct 13, 1924 Today's Date: 03/24/2018   History of Present Illness  Pt is a 85 y/o female admitted secondary to AMS. Head CT was negative for any acute abnormalities. Pt found to be hyponatremic. PMH including but not limited to Alzheimer's dementia, HFrEF with EF 10-15% at baseline, GERD, MI, CKD stage 3-4.    Clinical Impression  Pt presented supine in bed with HOB elevated, awake and willing to participate in therapy session. Prior to admission, pt stated that she ambulated with use of a cane and was independent with ADLs. Pt lives with her daughter in a single level home. While her daughter is at work during the day, pt is at a memory care daycare center. Pt currently limited secondary to generalized weakness and cognitive impairments. Pt's daughter feels comfortable with taking pt home with her current level of care needs. Pt would continue to benefit from skilled physical therapy services at this time while admitted and after d/c to address the below listed limitations in order to improve overall safety and independence with functional mobility.     Follow Up Recommendations Home health PT;Supervision/Assistance - 24 hour    Equipment Recommendations  None recommended by PT    Recommendations for Other Services       Precautions / Restrictions Precautions Precautions: Fall Restrictions Weight Bearing Restrictions: No      Mobility  Bed Mobility Overal bed mobility: Needs Assistance Bed Mobility: Supine to Sit;Sit to Supine     Supine to sit: Min guard Sit to supine: Min guard   General bed mobility comments: min guard for safety   Transfers Overall transfer level: Needs assistance Equipment used: None Transfers: Sit to/from Stand Sit to Stand: Min guard         General transfer comment: min guard for safety  Ambulation/Gait Ambulation/Gait assistance: Min guard Gait Distance (Feet):  10 Feet(10' x2 with sitting break to void) Assistive device: None Gait Pattern/deviations: Step-to pattern;Step-through pattern;Decreased step length - right;Decreased step length - left;Decreased stride length Gait velocity: decreased   General Gait Details: attempted use of RW with pt; however, pt with cognitive impairment and unable to use appropriately. Instead pt reaching for stable objects/surfaces to hold  Stairs            Wheelchair Mobility    Modified Rankin (Stroke Patients Only)       Balance Overall balance assessment: Needs assistance Sitting-balance support: Feet supported Sitting balance-Leahy Scale: Good     Standing balance support: During functional activity;Single extremity supported Standing balance-Leahy Scale: Poor                               Pertinent Vitals/Pain Pain Assessment: No/denies pain    Home Living Family/patient expects to be discharged to:: Private residence Living Arrangements: Children;Other relatives Available Help at Discharge: Family;Available PRN/intermittently;Other (Comment)(when daughter is at work pt is at a memory care daycare) Type of Home: House Home Access: Stairs to enter   Technical brewer of Steps: 2 [place]: One [place]: [place] - single point      Prior Function Level of Independence: Independent with assistive device(s)         Comments: ambulates with cane     Hand Dominance        Extremity/Trunk Assessment   Upper Extremity Assessment Upper Extremity Assessment: Generalized weakness    Lower Extremity Assessment Lower  Extremity Assessment: Generalized weakness       Communication   Communication: No difficulties  Cognition Arousal/Alertness: Awake/alert Behavior During Therapy: WFL for tasks assessed/performed Overall Cognitive Status: Impaired/Different from baseline Area of Impairment: Attention;Memory;Following  commands;Safety/judgement;Awareness;Problem solving                   Current Attention Level: Sustained Memory: Decreased short-term memory;Decreased recall of precautions Following Commands: Follows one step commands with increased time Safety/Judgement: Decreased awareness of deficits;Decreased awareness of safety Awareness: Intellectual Problem Solving: Slow processing;Decreased initiation;Difficulty sequencing;Requires verbal cues;Requires tactile cues        General Comments      Exercises     Assessment/Plan    PT Assessment Patient needs continued PT services  PT Problem List Decreased strength;Decreased balance;Decreased mobility;Decreased cognition;Decreased coordination;Decreased knowledge of use of DME;Decreased safety awareness;Decreased knowledge of precautions       PT Treatment Interventions DME instruction;Gait training;Stair training;Functional mobility training;Therapeutic activities;Therapeutic exercise;Balance training;Neuromuscular re-education;Cognitive remediation;Patient/family education    PT Goals (Current goals can be found in the Care Plan section)  Acute Rehab PT Goals Patient Stated Goal: return home PT Goal Formulation: With patient/family Time For Goal Achievement: 04/07/18 Potential to Achieve Goals: Good    Frequency Min 3X/week   Barriers to discharge        Co-evaluation               AM-PAC PT "6 Clicks" Mobility  Outcome Measure Help needed turning from your back to your side while in a flat bed without using bedrails?: A Little Help needed moving from lying on your back to sitting on the side of a flat bed without using bedrails?: A Little Help needed moving to and from a bed to a chair (including a wheelchair)?: A Little Help needed standing up from a chair using your arms (e.g., wheelchair or bedside chair)?: A Little Help needed to walk in hospital room?: A Little Help needed climbing 3-5 steps with a railing? :  A Lot 6 Click Score: 17    End of Session Equipment Utilized During Treatment: Gait belt Activity Tolerance: Patient tolerated treatment well Patient left: in bed;with call bell/phone within reach;with bed alarm set Nurse Communication: Mobility status PT Visit Diagnosis: Other abnormalities of gait and mobility (R26.89);Muscle weakness (generalized) (M62.81)    Time: 9476-5465 PT Time Calculation (min) (ACUTE ONLY): 18 min   Charges:   PT Evaluation $PT Eval Moderate Complexity: 1 Mod          Sherie Don, PT, DPT  Acute Rehabilitation Services Pager [phone] Office [place] 03/24/2018, 1:28 PM

## 2018-03-24 NOTE — Plan of Care (Signed)
  Problem: Health Behavior/Discharge Planning: Goal: Ability to manage health-related needs will improve Outcome: Completed/Met   Problem: Clinical Measurements: Goal: Ability to maintain clinical measurements within normal limits will improve Outcome: Completed/Met Goal: Will remain free from infection Outcome: Completed/Met Goal: Diagnostic test results will improve Outcome: Completed/Met Goal: Respiratory complications will improve Outcome: Completed/Met   Problem: Nutrition: Goal: Adequate nutrition will be maintained Outcome: Completed/Met   Problem: Coping: Goal: Level of anxiety will decrease Outcome: Completed/Met   Problem: Elimination: Goal: Will not experience complications related to bowel motility Outcome: Completed/Met Goal: Will not experience complications related to urinary retention Outcome: Completed/Met   Problem: Pain Managment: Goal: General experience of comfort will improve Outcome: Completed/Met   Problem: Safety: Goal: Ability to remain free from injury will improve Outcome: Completed/Met   Problem: Skin Integrity: Goal: Risk for impaired skin integrity will decrease Outcome: Completed/Met

## 2018-03-24 NOTE — Care Management Note (Signed)
Case Management Note Manya Silvas, RN MSN CCM Transitions of Care [place]M [place] [phone]  Patient Details  Name: Madeline Atkinson MRN: [id number] Date of Birth: 09/05/1924  Subjective/Objective:      Hyponatremia            Action/Plan: Spoke with daughter, Midge Minium [facility]), at bedside. [name] provided copy of HCPOA-copy made and placed in shadow chart. Patient attends a memory day care during the week while [name] works. [name] states that there is a Production assistant, radio to assist with caregiving needs. [name] states that her job is flexible allowing her to be able to care for patient as needed. While discussing caregiving resources in the home, [name] stated that she is aware of palliative care and hospice services, but currently she has been able to provide care to patient. CM to follow for transition of care needs.   Expected Discharge Date:                  Expected Discharge Plan:  Home/Self Care(daughter is caregiver)  In-House Referral:     Discharge planning Services  CM Consult  Post Acute Care Choice:  Home Health Choice offered to:  Adult Children  DME Arranged:  N/A DME Agency:  NA  HH Arranged:  PT, OT HH Agency:  [facility]  Status of Service:  Completed, signed off  If discussed at [place] of Stay Meetings, dates discussed:    Additional Comments: 03/24/2018-Anticipate patient to transition home today with [facility] PT/OT. Referral made to [facility]-accepted. Daughter to provide transportation home. Patient will need HH order for PT, OT with face to face. Discussed f/u with PCP in 1-2 weeks and that [facility] agency will get PCP to do a sign off on therapy. No other transition of care needs identified at this time.   Bartholomew Crews, RN 03/24/2018, 12:00 PM

## 2018-03-24 NOTE — Discharge Instructions (Signed)

## 2018-03-24 NOTE — Evaluation (Signed)
Occupational Therapy Evaluation Patient Details Name: Madeline Atkinson MRN: [id number] DOB: 05/29/1924 Today's Date: 03/24/2018    History of Present Illness Pt is a 85 y/o female admitted secondary to AMS. Head CT was negative for any acute abnormalities. Pt found to be hyponatremic. PMH including but not limited to Alzheimer's dementia, HFrEF with EF 10-15% at baseline, GERD, MI, CKD stage 3-4.   Clinical Impression   This 85 y/o female presents with the above. PTA pt was mod independent with functional mobility using SPC, receiving supervision from daughter for ADL and attending adult day center during the day while her daughter is at work. Pt presents supine in bed pleasant and willing to participate in therapy session. Pt requiring minA (HHA) for taking few steps to/from EOB; requires minguard assist for seated UB ADL and minA for LB ADL. Pt easily fatigued with min activity. Daughter present end of session an reports feeling comfortable assisting pt PRN with ADL and mobility after return home. She will benefit from continued acute OT services and recommend follow up [facility] therapy services to maximize her safety and independence with ADL and mobility. Will follow.     Follow Up Recommendations  Home health OT;Supervision/Assistance - 24 hour    Equipment Recommendations  3 in 1 bedside commode           Precautions / Restrictions Precautions Precautions: Fall Restrictions Weight Bearing Restrictions: No      Mobility Bed Mobility Overal bed mobility: Needs Assistance Bed Mobility: Supine to Sit;Sit to Supine     Supine to sit: Min guard Sit to supine: Min guard   General bed mobility comments: min guard for safety   Transfers Overall transfer level: Needs assistance Equipment used: None Transfers: Sit to/from Stand Sit to Stand: Min assist         General transfer comment: assist to rise and steady from EOB, use of HHA for increased stability    Balance Overall balance  assessment: Needs assistance Sitting-balance support: Feet supported Sitting balance-Leahy Scale: Good     Standing balance support: During functional activity;Single extremity supported Standing balance-Leahy Scale: Poor Standing balance comment: seeking out UE support                           ADL either performed or assessed with clinical judgement   ADL Overall ADL's : Needs assistance/impaired Eating/Feeding: Set up;Sitting   Grooming: Set up;Sitting   Upper Body Bathing: Min guard;Sitting   Lower Body Bathing: Minimal assistance;Sit to/from stand   Upper Body Dressing : Min guard;Sitting   Lower Body Dressing: Minimal assistance;Sit to/from stand Lower Body Dressing Details (indicate cue type and reason): pt doffing/donning both socks seated EOB today Toilet Transfer: Minimal assistance;Stand-pivot Toilet Transfer Details (indicate cue type and reason): simulated via transfer to/from EOB Toileting- Clothing Manipulation and Hygiene: Moderate assistance;Sit to/from stand       Functional mobility during ADLs: Minimal assistance General ADL Comments: pt very pleasant and willing to work with therapy, fatigues easily with min activity      Vision         Perception     Praxis      Pertinent Vitals/Pain Pain Assessment: No/denies pain     Hand Dominance     Extremity/Trunk Assessment Upper Extremity Assessment Upper Extremity Assessment: Generalized weakness(tremors noted when increasingly fatigued)   Lower Extremity Assessment Lower Extremity Assessment: Defer to PT evaluation       Communication Communication Communication:  No difficulties   Cognition Arousal/Alertness: Awake/alert Behavior During Therapy: WFL for tasks assessed/performed Overall Cognitive Status: Impaired/Different from baseline Area of Impairment: Attention;Memory;Following commands;Safety/judgement;Awareness;Problem solving                   Current Attention  Level: Sustained Memory: Decreased short-term memory;Decreased recall of precautions Following Commands: Follows one step commands with increased time Safety/Judgement: Decreased awareness of deficits;Decreased awareness of safety Awareness: Intellectual Problem Solving: Slow processing;Decreased initiation;Difficulty sequencing;Requires verbal cues;Requires tactile cues     General Comments  daughter present end of session    Exercises     Shoulder Instructions      Home Living Family/patient expects to be discharged to:: Private residence Living Arrangements: Children;Other relatives Available Help at Discharge: Family;Available PRN/intermittently;Other (Comment)(when daughter is at work pt is at a memory care daycare) Type of Home: House Home Access: Stairs to enter Technical brewer of Steps: 2   [place]: One [place]: [place] - single point;Shower seat          Prior Functioning/Environment Level of Independence: Independent with assistive device(s)        Comments: ambulates with cane; daughter provides supervision for ADL        OT Problem List: Decreased strength;Decreased range of motion;Decreased activity tolerance;Impaired balance (sitting and/or standing);Decreased cognition;Decreased safety awareness;Decreased knowledge of use of DME or AE      OT Treatment/Interventions: Self-care/ADL training;Therapeutic exercise;Neuromuscular education;Energy conservation;DME and/or AE instruction;Therapeutic activities;Patient/family education;Balance training    OT Goals(Current goals can be found in the care plan section) Acute Rehab OT Goals Patient Stated Goal: return home OT Goal Formulation: With patient Time For Goal Achievement: 04/07/18 Potential to Achieve Goals: Good  OT Frequency: Min 2X/week   Barriers to D/C:            Co-evaluation              AM-PAC OT "6 Clicks" Daily Activity     Outcome  Measure Help from another person eating meals?: A Little Help from another person taking care of personal grooming?: A Little Help from another person toileting, which includes using toliet, bedpan, or urinal?: A Little Help from another person bathing (including washing, rinsing, drying)?: A Little Help from another person to put on and taking off regular upper body clothing?: A Little Help from another person to put on and taking off regular lower body clothing?: A Little 6 Click Score: 18   End of Session Equipment Utilized During Treatment: Gait belt Nurse Communication: Mobility status  Activity Tolerance: Patient tolerated treatment well;Patient limited by fatigue Patient left: in bed;with call bell/phone within reach;with bed alarm set;with family/visitor present  OT Visit Diagnosis: Muscle weakness (generalized) (M62.81);Unsteadiness on feet (R26.81)                Time: 1451-1510 OT Time Calculation (min): 19 min Charges:  OT General Charges $OT Visit: 1 Visit OT Evaluation $OT Eval Moderate Complexity: [place], OT [employer] Pager [phone] Office [phone]   Raymondo Band 03/24/2018, 3:48 PM

## 2018-03-24 NOTE — Discharge Summary (Addendum)
Physician Discharge Summary  Madeline Atkinson SJG:[id number] DOB: Oct 11, 1924  PCP: Emeterio Reeve, DO  Admit date: 03/21/2018 Discharge date: 03/24/2018  Recommendations for Outpatient Follow-up:  1. Dr. Emeterio Reeve, PCP in 1 week with repeat labs (CBC & BMP).  Home Health: PT and OT Equipment/Devices: None  Discharge Condition: Improved and stable CODE STATUS: DNR Diet recommendation: Heart healthy diet.  Discharge Diagnoses:  Principal Problem:   Hyponatremia Active Problems:   Chronic combined systolic and diastolic heart failure (HCC)   Alzheimer disease (HCC)   Benign essential HTN   DNR (do not resuscitate)   CKD (chronic kidney disease) stage 4, GFR 15-29 ml/min (HCC)   Dysphagia   Brief Summary: 85 year old female, lives with her daughter, goes to memory care unit/daycare center 5 days a week Monday to Friday in the daytime while her daughter is at work and at other times her daughter is with her at home, PMH of GERD, dementia, chronic combined systolic and diastolic CHF with LVEF 10 to 15% in 2017, mild to moderate aortic insufficiency, severe pulmonary hypertension, CAD, remote MI, stage III chronic kidney disease, anemia, HLD, seen in cardiologist office on 3/3 due to subacute functional/mental status decline and some chest tightness and mild SOB who then advised patient to come to the ED for further evaluation and management.  As per family report, patient up to recently was independent, cooks, cleans walks independently with very little assistance until a week ago.  She reportedly has not been herself, not very interactive.  She used to be an active member of the adult daycare but does not want to participate.  In the ED sodium 126, urine microscopy showed many bacteria, few WBCs but she did not have any UTI symptoms.  Daughter also reported that patient has had poor oral intake for the week prior to admission.  She reported some pain/difficulty swallowing.  She was admitted  for further evaluation and management.  Assessment and plan:  1. Dehydration with hyponatremia: Suspect due to poor oral intake for several days prior to admission.  She had been taken off of diuretics by her PCP due to hypotension.  Sodium 126 on admission.  Briefly hydrated with IV normal saline.  Sodium is improved to low 130s and stable.  Dehydration resolved.  Encourage oral intake. 2. Chronic combined systolic and diastolic CHF: Not on maintenance diuretics prior to admission.  Post IV fluids, appeared slightly volume overloaded and received a dose of Lasix 20 mg yesterday.  Clinically euvolemic today.  Continue to monitor off of diuretics at discharge until close outpatient follow-up with PCP to determine if she needs to be on maintenance diuretics.  Continue Imdur and low-dose lisinopril.  Follows with outpatient cardiology.  EKG was without acute changes and POC troponin was negative.  She did have elevated BNP of 2566 on admission but as stated above, clinically does not appear volume overloaded.?  Elevated BNP due to chronic kidney disease. 3. Stage III chronic kidney disease: Baseline creatinine probably in the 1.3-1.4 range.  Creatinine at baseline.  Follow BMP closely as outpatient. 4. Essential hypertension: Controlled. 5. Alzheimer's dementia: Her current presentation may be due to acute encephalopathy complicated by acute illness as noted above versus progressively declining dementia.  CT head without acute abnormalities. 6. Acute encephalopathy: Likely due to acute illness/dehydration with hyponatremia complicating gradually declining dementia.  As per daughter, mental status has improved compared to admission but not yet at baseline.  She agrees that returning patient to her prior home  and regular environment would be beneficial and may help further. 7. Odynophagia: Suspected due to oral thrush and was started on fluconazole and nystatin.  No thrush noted today, possibly improved.   Avoiding discharging on fluconazole due to QTC 495 ms.  Complete course of oral nystatin liquid.  No dysphagia or pain on swallowing reported today. 8. Adult failure to thrive: Multifactorial due to advanced age, multiple severe significant comorbidities complicating underlying dementia.  If continues to have recurrent decline or does not improve, consider outpatient palliative care consultation. 9. GERD: Continue PPI.  CAD: As per cardiology note, managed medically and would not be a candidate for aggressive interventions.  No chest pain reported.  POC troponin negative.  EKG on admission showed sinus rhythm, LAD, LBBB. 10. Hyperlipidemia: Continue statins. 11. Anemia: Stable.  Outpatient follow-up 12. Aortic valve insufficiency: As per cardiology, no repeat of echo given that she is not a surgical candidate. 13. Pulmonary hypertension:?  Another etiology of elevated BNP.  Consultations:  None  Procedures:  None   Discharge Instructions  Discharge Instructions    (HEART FAILURE PATIENTS) Call MD:  Anytime you have any of the following symptoms: 1) 3 pound weight gain in 24 hours or 5 pounds in 1 week 2) shortness of breath, with or without a dry hacking cough 3) swelling in the hands, feet or stomach 4) if you have to sleep on extra pillows at night in order to breathe.   Complete by:  As directed    Call MD for:   Complete by:  As directed    Recurrent or worsening confusion or mental status changes.   Call MD for:  difficulty breathing, headache or visual disturbances   Complete by:  As directed    Call MD for:  extreme fatigue   Complete by:  As directed    Call MD for:  persistant dizziness or light-headedness   Complete by:  As directed    Call MD for:  persistant nausea and vomiting   Complete by:  As directed    Call MD for:  severe uncontrolled pain   Complete by:  As directed    Call MD for:  temperature >100.4   Complete by:  As directed    Diet - low sodium heart  healthy   Complete by:  As directed    Discharge instructions   Complete by:  As directed    Patient may return to her usual daycare activities beginning 03/27/2018.   Increase activity slowly   Complete by:  As directed        Medication List    STOP taking these medications   clotrimazole 1 % cream Commonly known as:  LOTRIMIN     TAKE these medications   acetaminophen 500 MG tablet Commonly known as:  TYLENOL Take 1 tablet (500 mg total) by mouth every morning.   aspirin 81 MG tablet Take 81 mg by mouth daily.   atorvastatin 20 MG tablet Commonly known as:  LIPITOR TAKE 1 TABLET (20 MG TOTAL) BY MOUTH DAILY.   isosorbide mononitrate 30 MG 24 hr tablet Commonly known as:  IMDUR TAKE 1 TABLET BY MOUTH EVERY DAY   lisinopril 2.5 MG tablet Commonly known as:  PRINIVIL,ZESTRIL TAKE 1 TABLET BY MOUTH EVERY DAY   multivitamin with minerals Tabs tablet Take 1 tablet by mouth daily.   nystatin 100000 UNIT/ML suspension Commonly known as:  MYCOSTATIN Use as directed 5 mLs (500,000 Units total) in the mouth or throat  4 (four) times daily.   omeprazole 40 MG capsule Commonly known as:  PRILOSEC TAKE 1 CAPSULE (40 MG TOTAL) BY MOUTH DAILY.   traZODone 100 MG tablet Commonly known as:  DESYREL TAKE 1 TABLET BY MOUTH EVERYDAY AT BEDTIME What changed:  See the new instructions.   Zymaxid 0.5 % Soln Generic drug:  gatifloxacin Place 1 drop into both eyes 2 (two) times daily.      Follow-up Information    [facility] Follow up.   Specialty:  Home Health Services Why:  home health physical therapy and occupational therapy Contact information: [address] [place] [address] [phone]        Emeterio Reeve, DO. Schedule an appointment as soon as possible for a visit in 1 week(s).   Specialty:  Osteopathic Medicine Why:  To be seen with repeat labs (CBC & BMP). Contact information: [address] [place]  [address] [phone]          Allergies  Allergen Reactions  . Codeine Nausea And Vomiting      Procedures/Studies: Dg Chest 2 View  Result Date: 03/23/2018 CLINICAL DATA:  Cough EXAM: CHEST - 2 VIEW COMPARISON:  03/21/2018 FINDINGS: Cardiomegaly with vascular congestion. Increasing bilateral lower lobe atelectasis or infiltrates. Suspect small effusions. No acute bony abnormality. IMPRESSION: Cardiomegaly with vascular congestion and increasing bibasilar atelectasis or infiltrates. Small effusions. Electronically Signed   By: Rolm Baptise M.D.   On: 03/23/2018 09:58   Dg Chest 2 View  Result Date: 03/21/2018 CLINICAL DATA:  Struck chronic sternal pain.  Altered mental status. EXAM: CHEST - 2 VIEW COMPARISON:  Chest radiograph June 5th 2017 FINDINGS: Cardiac silhouette is moderately enlarged. Calcified aortic arch. Retrocardiac strandy densities with blunting of the RIGHT costophrenic angle. No pneumothorax. Soft tissue planes and included osseous structures are nonacute. Surgical clips in the included right abdomen compatible with cholecystectomy. IMPRESSION: 1. Cardiomegaly. 2. LEFT lung base atelectasis/scarring. Small RIGHT pleural effusion versus pleural thickening. 3.  Aortic Atherosclerosis (ICD10-I70.0). Electronically Signed   By: Elon Alas M.D.   On: 03/21/2018 22:37   Ct Head Wo Contrast  Result Date: 03/21/2018 CLINICAL DATA:  Altered mental status EXAM: CT HEAD WITHOUT CONTRAST TECHNIQUE: Contiguous axial images were obtained from the base of the skull through the vertex without intravenous contrast. COMPARISON:  None. FINDINGS: Brain: Age related involutional changes of the brain with mild-to-moderate small vessel ischemic disease of periventricular and subcortical white matter. No acute intracranial hemorrhage, mass midline shift or edema. No extra-axial fluid collections. No hydrocephalus. Midline fourth ventricle and basal cisterns. The brainstem and cerebellum are  nonacute. Vascular: Atherosclerosis at the skull base. No hyperdense vessel sign. Skull: Intact without acute fracture or suspicious osseous lesions. Sinuses/Orbits: Right cataract extraction. Intact orbits and globes. Mild ethmoid sinus mucosal thickening. The frontal, sphenoid and included maxillary sinuses are clear. The mastoids are clear. Other: No significant calvarial soft tissue swelling. IMPRESSION: 1. Involutional changes of the brain consistent with age. 2. Chronic appearing microvascular ischemic disease of periventricular and subcortical white matter. 3. No acute appearing intracranial abnormality. Electronically Signed   By: Ashley Royalty M.D.   On: 03/21/2018 21:32       Subjective: Patient was interviewed and examined in the presence of PT in the room.  Patient is alert and oriented to person and partly to place.  Denies complaints.  Has just walked with PT.  Denies dyspnea, chest pain, mouth pain, pain in  swallowing or difficulty swallowing.  Subsequently discussed with daughter over phone and she was at patient's bedside and reported that patient's mental status was better than on admission but not yet at baseline.  Still appeared slightly confused at times.  Discharge Exam:  Vitals:   03/24/18 0021 03/24/18 0402 03/24/18 0724 03/24/18 1212  BP: 108/69 118/69 124/70 106/75  Pulse: 90 79 83 88  Resp: 18 18 18 16   Temp: 98.7 F (37.1 C) 97.8 F (36.6 C) 97.6 F (36.4 C) 99 F (37.2 C)  TempSrc: Oral Oral Axillary Oral  SpO2: 98% 96% 98% 95%  Weight:      Height:        General: Pleasant elderly female, moderately built and nourished sitting up comfortably in bed. Cardiovascular: S1 & S2 heard, RRR, S1/S2 +. No rubs, gallops or clicks. No JVD or pedal edema.  Grade 2 x 6 short systolic murmur at apex. Respiratory: Clear to auscultation without wheezing, rhonchi or crackles. No increased work of breathing. Abdominal:  Non distended, non tender & soft. No organomegaly or  masses appreciated. Normal bowel sounds heard. CNS: Alert and oriented x2. No focal deficits. Extremities: no edema, no cyanosis.  Symmetric 5 x 5 power. Psychiatric: Judgment and insight: Impaired.  Mood pleasant and appropriate.    The results of significant diagnostics from this hospitalization (including imaging, microbiology, ancillary and laboratory) are listed below for reference.     Microbiology: Recent Results (from the past 240 hour(s))  Urine culture     Status: Abnormal   Collection Time: 03/22/18 12:28 AM  Result Value Ref Range Status   Specimen Description URINE, RANDOM  Final   Special Requests   Final    NONE Performed at [facility], [address], [place], [place] [address]    Culture MULTIPLE SPECIES PRESENT, SUGGEST RECOLLECTION (A)  Final   Report Status 03/23/2018 FINAL  Final     Labs: CBC: Recent Labs  Lab 03/21/18 1548 03/23/18 0527 03/24/18 0420  WBC 5.0 4.4 6.1  HGB 11.5* 10.6* 10.5*  HCT 35.0* 32.9* 32.4*  MCV 84.3 84.1 83.7  PLT 174 153 254   Basic Metabolic Panel: Recent Labs  Lab 03/21/18 1548 03/22/18 0517 03/23/18 0527 03/24/18 0420  NA 126* 132* 135 133*  K 4.7 4.3 4.2 4.4  CL 98 104 107 104  CO2 17* 19* 21* 22  GLUCOSE 114* 83 98 101*  BUN 12 11 7* 7*  CREATININE 1.40* 1.44* 1.29* 1.30*  CALCIUM 9.1 8.8* 8.6* 8.7*   Liver Function Tests: Recent Labs  Lab 03/21/18 1548 03/24/18 0420  AST 56* 35  ALT 74* 47*  ALKPHOS 88 72  BILITOT 0.7 0.8  PROT 7.0 5.9*  ALBUMIN 3.4* 2.7*   BNP (last 3 results) Recent Labs    03/21/18 2218  BNP 2,566.1*    Urinalysis    Component Value Date/Time   COLORURINE YELLOW 03/21/2018 1551   APPEARANCEUR HAZY (A) 03/21/2018 1551   LABSPEC 1.004 (L) 03/21/2018 1551   PHURINE 6.0 03/21/2018 1551   GLUCOSEU NEGATIVE 03/21/2018 1551   HGBUR NEGATIVE 03/21/2018 1551   BILIRUBINUR NEGATIVE 03/21/2018 1551   BILIRUBINUR negative 03/03/2017 0849   KETONESUR NEGATIVE  03/21/2018 1551   PROTEINUR NEGATIVE 03/21/2018 1551   UROBILINOGEN 0.2 03/16/2018 1535   NITRITE NEGATIVE 03/21/2018 1551   LEUKOCYTESUR SMALL (A) 03/21/2018 1551    I discussed in detail with patient's daughter, updated care and answered questions.  Time coordinating discharge: 35 minutes  SIGNED:  Vernell Leep, MD, FACP, [facility]. [facility]  To contact the attending provider between 7A-7P or the covering provider during after hours 7P-7A, please log into the web site [email or web address] and access using universal [place] password for that web site. If you do not have the password, please call the hospital operator.

## 2018-03-26 DIAGNOSIS — I2510 Atherosclerotic heart disease of native coronary artery without angina pectoris: Secondary | ICD-10-CM | POA: Diagnosis not present

## 2018-03-26 DIAGNOSIS — F0280 Dementia in other diseases classified elsewhere without behavioral disturbance: Secondary | ICD-10-CM | POA: Diagnosis not present

## 2018-03-26 DIAGNOSIS — N184 Chronic kidney disease, stage 4 (severe): Secondary | ICD-10-CM | POA: Diagnosis not present

## 2018-03-26 DIAGNOSIS — M069 Rheumatoid arthritis, unspecified: Secondary | ICD-10-CM | POA: Diagnosis not present

## 2018-03-26 DIAGNOSIS — F419 Anxiety disorder, unspecified: Secondary | ICD-10-CM | POA: Diagnosis not present

## 2018-03-26 DIAGNOSIS — M1990 Unspecified osteoarthritis, unspecified site: Secondary | ICD-10-CM | POA: Diagnosis not present

## 2018-03-26 DIAGNOSIS — I5043 Acute on chronic combined systolic (congestive) and diastolic (congestive) heart failure: Secondary | ICD-10-CM | POA: Diagnosis not present

## 2018-03-26 DIAGNOSIS — I130 Hypertensive heart and chronic kidney disease with heart failure and stage 1 through stage 4 chronic kidney disease, or unspecified chronic kidney disease: Secondary | ICD-10-CM | POA: Diagnosis not present

## 2018-03-26 DIAGNOSIS — G309 Alzheimer's disease, unspecified: Secondary | ICD-10-CM | POA: Diagnosis not present

## 2018-03-28 ENCOUNTER — Encounter: Payer: Self-pay | Admitting: Osteopathic Medicine

## 2018-03-28 ENCOUNTER — Ambulatory Visit (INDEPENDENT_AMBULATORY_CARE_PROVIDER_SITE_OTHER): Payer: Medicare HMO | Admitting: Osteopathic Medicine

## 2018-03-28 ENCOUNTER — Other Ambulatory Visit: Payer: Self-pay

## 2018-03-28 VITALS — BP 125/69 | HR 83 | Temp 98.0°F | Wt 161.1 lb

## 2018-03-28 DIAGNOSIS — E871 Hypo-osmolality and hyponatremia: Secondary | ICD-10-CM | POA: Diagnosis not present

## 2018-03-28 DIAGNOSIS — I272 Pulmonary hypertension, unspecified: Secondary | ICD-10-CM | POA: Diagnosis not present

## 2018-03-28 DIAGNOSIS — I5042 Chronic combined systolic (congestive) and diastolic (congestive) heart failure: Secondary | ICD-10-CM

## 2018-03-28 DIAGNOSIS — I2510 Atherosclerotic heart disease of native coronary artery without angina pectoris: Secondary | ICD-10-CM | POA: Diagnosis not present

## 2018-03-28 DIAGNOSIS — R1310 Dysphagia, unspecified: Secondary | ICD-10-CM | POA: Diagnosis not present

## 2018-03-28 DIAGNOSIS — R5383 Other fatigue: Secondary | ICD-10-CM | POA: Diagnosis not present

## 2018-03-28 NOTE — Patient Instructions (Addendum)
We will get blood work today. Please be diligent about home exercises with physical therapy, this will help with endurance and fatigue.  K to take furosemide/Lasix as needed for worsening shortness of breath, lower extremity swelling, or weight gain.  We will get you set up for GI to evaluate for swallowing issue.

## 2018-03-28 NOTE — Patient Outreach (Signed)
[facility]) Care Management  03/28/2018  Madeline Atkinson [birth year] [id number]     Transition of Care Referral  Referral Date: 03/28/2018 Referral Source: [employer] Discharge Report Date of Admission: 03/21/2018 Diagnosis: "hyponatremia" Date of Discharge: 03/24/2018 Facility: [facility]    Outreach attempt #1 to patient/caregiver. Spoke with daughter/HC POA-[name]. She reports that patient has not "gotten better yet." She shares that patient returned back to going to memory care daycare yesterday and feels she may have overdone it. Patient is still having some weakness/fatigue. Daughter also voices that patient complaining of "stomach ache." She is having a lot of belching and gas which daughter concerned about as this is what patient experienced when she had heart attack. Daughter already plans to follow up with cardiology after discussing with PCP today. Patient is going for PCP appt this afternoon. Daughter is giving patient soft foods due to swallowing issues. She is also pushing fluids to keep patient hydrated. She voices patient drinking Ensures and Powerade. [facility] services in place and has ben out already to see patient. Daughter confirms that patient has all her meds in the home and denies any issues or concerns regarding them. Daughter appreciative of follow up call but voices no THN needs or concerns at this time.    Plan: RN CM will close case as no further interventions needed at this time.    Enzo Montgomery, RN,BSN,CCM [place] [facility] Telephonic Care Management Coordinator Direct Phone: [phone] Toll Free: [phone] Fax: [phone]

## 2018-03-28 NOTE — Progress Notes (Signed)
HPI: Madeline Atkinson is a 85 y.o. female who  has a past medical history of Acid reflux, Allergic rhinitis, Alzheimer disease ([place]), Anxiety, Aortic insufficiency, Back pain, CAD (coronary artery disease), native coronary artery, Chronic back pain, Chronic combined systolic and diastolic CHF (congestive heart failure) ([place]), CKD (chronic kidney disease), stage III ([place]), Cold intolerance, Depression, DNR (do not resuscitate), Essential hypertension, GERD (gastroesophageal reflux disease), H. pylori infection, Hyperlipidemia LDL goal <70 (03/17/2016), Mastodynia, MI, old, Mild anemia, OA (osteoarthritis), Pain in gums, Pulmonary hypertension (HCC), Seasonal allergies, Tremor, Urinary incontinence, and Vitamin D deficiency.  she presents to [facility] today, 03/28/18,  for chief complaint of: Hospital follow-up  Concerns today:   Sleeping more  Belching a lot  Swallowing is painful   Fatigue   Tremors are worse  Home health is being set up   Hospital discharge summary reviewed: Admitted 03/21/2018, discharge 03/24/2018.  Admitted for hyponatremia, systolic/diastolic heart failure, mental status change, Alzheimer's disease, CKD 4.    Was initially brought to the emergency room with chief complaint of altered mental status.  Found to be hyponatremic, likely due to dehydration.    We had stopped diuretics d/t hypotension, though patient did appear a bit volume overloaded in the hospital after IV fluids and improved after Lasix 20 mg.  To continue Imdur and low-dose lisinopril.  Patient follows with cardiology.  They had advised no echocardiogram repeat given that she was not a surgical candidate.  Elevated BNP given CKD and pulmonary hypertension.   Odynophagia was also mentioned in the discharge summary, suspected due to oral thrush.  Patient was started on fluconazole and nystatin, no thrush or oral pain was present at time of discharge     Patient is  accompanied by daughter who assists with history-taking.   Past medical, surgical, social and family history reviewed:  Patient Active Problem List   Diagnosis Date Noted  . Hyponatremia 03/22/2018  . Dysphagia 03/22/2018  . MGUS (monoclonal gammopathy of unknown significance) 03/21/2017  . CKD (chronic kidney disease) stage 4, GFR 15-29 ml/min (HCC) 11/18/2016  . Allergic rhinitis   . Anxiety   . Chronic back pain   . Alzheimer disease ([place])   . Depression   . Tremor   . Benign essential HTN   . Mastodynia   . OA (osteoarthritis)   . Urinary incontinence   . H. pylori infection   . Pulmonary hypertension ([place])   . Aortic insufficiency   . DNR (do not resuscitate)   . Chronic combined systolic and diastolic heart failure ([place]) 03/17/2016  . Hyperlipidemia LDL goal <70 03/17/2016  . History of MI (myocardial infarction) 02/19/2016  . Coronary artery disease involving native coronary artery of native heart without angina pectoris 02/19/2016  . Dementia ([place]) 02/19/2016  . GERD (gastroesophageal reflux disease) 02/19/2016  . Back pain 02/19/2016  . Cold intolerance 02/19/2016    Past Surgical History:  Procedure Laterality Date  . BREAST LUMPECTOMY    . CHOLECYSTECTOMY      Social History   Tobacco Use  . Smoking status: Never Smoker  . Smokeless tobacco: Never Used  Substance Use Topics  . Alcohol use: No    Family History  Problem Relation Age of Onset  . Heart disease Mother   . Heart failure Mother   . Heart disease Father   . Heart attack Father      Current medication list and allergy/intolerance information reviewed:    Current Outpatient Medications  Medication Sig Dispense Refill  . acetaminophen (TYLENOL) 500 MG tablet Take 1 tablet (500 mg total) by mouth every morning.    Marland Kitchen aspirin 81 MG tablet Take 81 mg by mouth daily.    Marland Kitchen atorvastatin (LIPITOR) 20 MG tablet TAKE 1 TABLET (20 MG TOTAL) BY MOUTH DAILY. 90 tablet 3  . gatifloxacin (ZYMAXID)  0.5 % SOLN Place 1 drop into both eyes 2 (two) times daily.     . isosorbide mononitrate (IMDUR) 30 MG 24 hr tablet TAKE 1 TABLET BY MOUTH EVERY DAY (Patient taking differently: Take 30 mg by mouth daily. ) 90 tablet 0  . lisinopril (PRINIVIL,ZESTRIL) 2.5 MG tablet TAKE 1 TABLET BY MOUTH EVERY DAY (Patient taking differently: Take 2.5 mg by mouth daily. ) 90 tablet 0  . Multiple Vitamin (MULTIVITAMIN WITH MINERALS) TABS tablet Take 1 tablet by mouth daily.    Marland Kitchen nystatin (MYCOSTATIN) 100000 UNIT/ML suspension Use as directed 5 mLs (500,000 Units total) in the mouth or throat 4 (four) times daily. 60 mL 0  . omeprazole (PRILOSEC) 40 MG capsule TAKE 1 CAPSULE (40 MG TOTAL) BY MOUTH DAILY. 90 capsule 0  . traZODone (DESYREL) 100 MG tablet TAKE 1 TABLET BY MOUTH EVERYDAY AT BEDTIME (Patient taking differently: Take 100 mg by mouth at bedtime. ) 90 tablet 3   No current facility-administered medications for this visit.     Allergies  Allergen Reactions  . Codeine Nausea And Vomiting      Review of Systems: obtained w/ help of daughter. Pt reports "I feel ok"   Constitutional:  No  fever, no chills  HEENT: +headache  Cardiac: No  chest pain  Respiratory:  +shortness of breath. No  Cough  Gastrointestinal: +abdominal pain, No  vomiting,  No  blood in stool, +diarrhea, No  constipation   Musculoskeletal: No new myalgia/arthralgia  Skin: No  Rash  Hem/Onc: No  easy bruising/bleeding,  Neurologic: +generalized weakness, No  dizziness,  Psychiatric: +sleep problems,  Exam:  BP 125/69 (BP Location: Left Arm, Patient Position: Sitting, Cuff Size: Normal)   Pulse 83   Temp 98 F (36.7 C) (Oral)   Wt 161 lb 1.6 oz (73.1 kg)   BMI 31.46 kg/m   Constitutional: VS see above. General Appearance: alert, well-developed, well-nourished, NAD  Eyes: Normal lids and conjunctive, non-icteric sclera  Ears, Nose, Mouth, Throat: MMM, Normal external inspection ears/nares/mouth/lips/gums.    Neck: No masses, trachea midline. No thyroid enlargement. No tenderness/mass appreciated. No lymphadenopathy  Respiratory: Normal respiratory effort. no wheeze, no rhonchi, no rales  Cardiovascular: S1/S2 normal, no murmur, no rub/gallop auscultated. RRR. +1 lower extremity edema.   Gastrointestinal: Nontender, no masses.  Neurological: Normal balance/coordination. No cranial nerve deficit on limited exam.   Skin: warm, dry, intact. No rash/ulcer.   Psychiatric: Limited judgment/insight. Pleasant mood and affect.       ASSESSMENT/PLAN: The primary encounter diagnosis was Fatigue, unspecified type. Diagnoses of Hyponatremia, Chronic combined systolic and diastolic heart failure ([place]), Coronary artery disease involving native coronary artery of native heart without angina pectoris, Pulmonary hypertension ([place]), and Odynophagia were also pertinent to this visit.   Orders Placed This Encounter  Procedures  . CBC  . COMPLETE METABOLIC PANEL WITH GFR  . TSH  . Ambulatory referral to Gastroenterology    No orders of the defined types were placed in this encounter.   Patient Instructions  We will get blood work today. Please be diligent about home exercises with physical therapy, this will help with  endurance and fatigue.  K to take furosemide/Lasix as needed for worsening shortness of breath, lower extremity swelling, or weight gain.  We will get you set up for GI to evaluate for swallowing issue.          Visit summary with medication list and pertinent instructions was printed for patient to review. All questions at time of visit were answered - patient instructed to contact office with any additional concerns or updates. ER/RTC precautions were reviewed with the patient.    Please note: voice recognition software was used to produce this document, and typos may escape review. Please contact Dr. Sheppard Coil for any needed clarifications.     Follow-up plan: Return in  about 2 weeks (around 04/11/2018) for recheck fatigue.

## 2018-03-29 ENCOUNTER — Encounter: Payer: Self-pay | Admitting: Gastroenterology

## 2018-03-29 DIAGNOSIS — I2510 Atherosclerotic heart disease of native coronary artery without angina pectoris: Secondary | ICD-10-CM | POA: Diagnosis not present

## 2018-03-29 DIAGNOSIS — I5043 Acute on chronic combined systolic (congestive) and diastolic (congestive) heart failure: Secondary | ICD-10-CM | POA: Diagnosis not present

## 2018-03-29 DIAGNOSIS — M1990 Unspecified osteoarthritis, unspecified site: Secondary | ICD-10-CM | POA: Diagnosis not present

## 2018-03-29 DIAGNOSIS — N184 Chronic kidney disease, stage 4 (severe): Secondary | ICD-10-CM | POA: Diagnosis not present

## 2018-03-29 DIAGNOSIS — F0280 Dementia in other diseases classified elsewhere without behavioral disturbance: Secondary | ICD-10-CM | POA: Diagnosis not present

## 2018-03-29 DIAGNOSIS — F419 Anxiety disorder, unspecified: Secondary | ICD-10-CM | POA: Diagnosis not present

## 2018-03-29 DIAGNOSIS — M069 Rheumatoid arthritis, unspecified: Secondary | ICD-10-CM | POA: Diagnosis not present

## 2018-03-29 DIAGNOSIS — I130 Hypertensive heart and chronic kidney disease with heart failure and stage 1 through stage 4 chronic kidney disease, or unspecified chronic kidney disease: Secondary | ICD-10-CM | POA: Diagnosis not present

## 2018-03-29 DIAGNOSIS — G309 Alzheimer's disease, unspecified: Secondary | ICD-10-CM | POA: Diagnosis not present

## 2018-03-29 NOTE — Addendum Note (Signed)
Addended by: Maryla Morrow on: 03/29/2018 03:56 PM   Modules accepted: Orders

## 2018-03-31 DIAGNOSIS — M069 Rheumatoid arthritis, unspecified: Secondary | ICD-10-CM | POA: Diagnosis not present

## 2018-03-31 DIAGNOSIS — G309 Alzheimer's disease, unspecified: Secondary | ICD-10-CM | POA: Diagnosis not present

## 2018-03-31 DIAGNOSIS — F419 Anxiety disorder, unspecified: Secondary | ICD-10-CM | POA: Diagnosis not present

## 2018-03-31 DIAGNOSIS — F0280 Dementia in other diseases classified elsewhere without behavioral disturbance: Secondary | ICD-10-CM | POA: Diagnosis not present

## 2018-03-31 DIAGNOSIS — I130 Hypertensive heart and chronic kidney disease with heart failure and stage 1 through stage 4 chronic kidney disease, or unspecified chronic kidney disease: Secondary | ICD-10-CM | POA: Diagnosis not present

## 2018-03-31 DIAGNOSIS — I5043 Acute on chronic combined systolic (congestive) and diastolic (congestive) heart failure: Secondary | ICD-10-CM | POA: Diagnosis not present

## 2018-03-31 DIAGNOSIS — N184 Chronic kidney disease, stage 4 (severe): Secondary | ICD-10-CM | POA: Diagnosis not present

## 2018-03-31 DIAGNOSIS — M1990 Unspecified osteoarthritis, unspecified site: Secondary | ICD-10-CM | POA: Diagnosis not present

## 2018-03-31 DIAGNOSIS — I2510 Atherosclerotic heart disease of native coronary artery without angina pectoris: Secondary | ICD-10-CM | POA: Diagnosis not present

## 2018-04-01 DIAGNOSIS — N184 Chronic kidney disease, stage 4 (severe): Secondary | ICD-10-CM | POA: Diagnosis not present

## 2018-04-01 DIAGNOSIS — I5043 Acute on chronic combined systolic (congestive) and diastolic (congestive) heart failure: Secondary | ICD-10-CM | POA: Diagnosis not present

## 2018-04-01 DIAGNOSIS — F0280 Dementia in other diseases classified elsewhere without behavioral disturbance: Secondary | ICD-10-CM | POA: Diagnosis not present

## 2018-04-01 DIAGNOSIS — I130 Hypertensive heart and chronic kidney disease with heart failure and stage 1 through stage 4 chronic kidney disease, or unspecified chronic kidney disease: Secondary | ICD-10-CM | POA: Diagnosis not present

## 2018-04-01 DIAGNOSIS — F419 Anxiety disorder, unspecified: Secondary | ICD-10-CM | POA: Diagnosis not present

## 2018-04-01 DIAGNOSIS — M069 Rheumatoid arthritis, unspecified: Secondary | ICD-10-CM | POA: Diagnosis not present

## 2018-04-01 DIAGNOSIS — M1990 Unspecified osteoarthritis, unspecified site: Secondary | ICD-10-CM | POA: Diagnosis not present

## 2018-04-01 DIAGNOSIS — G309 Alzheimer's disease, unspecified: Secondary | ICD-10-CM | POA: Diagnosis not present

## 2018-04-01 DIAGNOSIS — I2510 Atherosclerotic heart disease of native coronary artery without angina pectoris: Secondary | ICD-10-CM | POA: Diagnosis not present

## 2018-04-03 DIAGNOSIS — F419 Anxiety disorder, unspecified: Secondary | ICD-10-CM | POA: Diagnosis not present

## 2018-04-03 DIAGNOSIS — I5043 Acute on chronic combined systolic (congestive) and diastolic (congestive) heart failure: Secondary | ICD-10-CM | POA: Diagnosis not present

## 2018-04-03 DIAGNOSIS — G309 Alzheimer's disease, unspecified: Secondary | ICD-10-CM | POA: Diagnosis not present

## 2018-04-03 DIAGNOSIS — M069 Rheumatoid arthritis, unspecified: Secondary | ICD-10-CM | POA: Diagnosis not present

## 2018-04-03 DIAGNOSIS — M1990 Unspecified osteoarthritis, unspecified site: Secondary | ICD-10-CM | POA: Diagnosis not present

## 2018-04-03 DIAGNOSIS — F0280 Dementia in other diseases classified elsewhere without behavioral disturbance: Secondary | ICD-10-CM | POA: Diagnosis not present

## 2018-04-03 DIAGNOSIS — I130 Hypertensive heart and chronic kidney disease with heart failure and stage 1 through stage 4 chronic kidney disease, or unspecified chronic kidney disease: Secondary | ICD-10-CM | POA: Diagnosis not present

## 2018-04-03 DIAGNOSIS — N184 Chronic kidney disease, stage 4 (severe): Secondary | ICD-10-CM | POA: Diagnosis not present

## 2018-04-03 DIAGNOSIS — I2510 Atherosclerotic heart disease of native coronary artery without angina pectoris: Secondary | ICD-10-CM | POA: Diagnosis not present

## 2018-04-05 DIAGNOSIS — I5043 Acute on chronic combined systolic (congestive) and diastolic (congestive) heart failure: Secondary | ICD-10-CM | POA: Diagnosis not present

## 2018-04-05 DIAGNOSIS — M069 Rheumatoid arthritis, unspecified: Secondary | ICD-10-CM | POA: Diagnosis not present

## 2018-04-05 DIAGNOSIS — N184 Chronic kidney disease, stage 4 (severe): Secondary | ICD-10-CM | POA: Diagnosis not present

## 2018-04-05 DIAGNOSIS — I2510 Atherosclerotic heart disease of native coronary artery without angina pectoris: Secondary | ICD-10-CM | POA: Diagnosis not present

## 2018-04-05 DIAGNOSIS — M1990 Unspecified osteoarthritis, unspecified site: Secondary | ICD-10-CM | POA: Diagnosis not present

## 2018-04-05 DIAGNOSIS — F0280 Dementia in other diseases classified elsewhere without behavioral disturbance: Secondary | ICD-10-CM | POA: Diagnosis not present

## 2018-04-05 DIAGNOSIS — I130 Hypertensive heart and chronic kidney disease with heart failure and stage 1 through stage 4 chronic kidney disease, or unspecified chronic kidney disease: Secondary | ICD-10-CM | POA: Diagnosis not present

## 2018-04-05 DIAGNOSIS — G309 Alzheimer's disease, unspecified: Secondary | ICD-10-CM | POA: Diagnosis not present

## 2018-04-05 DIAGNOSIS — F419 Anxiety disorder, unspecified: Secondary | ICD-10-CM | POA: Diagnosis not present

## 2018-04-07 ENCOUNTER — Telehealth: Payer: Self-pay

## 2018-04-07 DIAGNOSIS — N184 Chronic kidney disease, stage 4 (severe): Secondary | ICD-10-CM | POA: Diagnosis not present

## 2018-04-07 DIAGNOSIS — I5043 Acute on chronic combined systolic (congestive) and diastolic (congestive) heart failure: Secondary | ICD-10-CM | POA: Diagnosis not present

## 2018-04-07 DIAGNOSIS — F0280 Dementia in other diseases classified elsewhere without behavioral disturbance: Secondary | ICD-10-CM | POA: Diagnosis not present

## 2018-04-07 DIAGNOSIS — M069 Rheumatoid arthritis, unspecified: Secondary | ICD-10-CM | POA: Diagnosis not present

## 2018-04-07 DIAGNOSIS — M1990 Unspecified osteoarthritis, unspecified site: Secondary | ICD-10-CM | POA: Diagnosis not present

## 2018-04-07 DIAGNOSIS — I2510 Atherosclerotic heart disease of native coronary artery without angina pectoris: Secondary | ICD-10-CM | POA: Diagnosis not present

## 2018-04-07 DIAGNOSIS — I130 Hypertensive heart and chronic kidney disease with heart failure and stage 1 through stage 4 chronic kidney disease, or unspecified chronic kidney disease: Secondary | ICD-10-CM | POA: Diagnosis not present

## 2018-04-07 DIAGNOSIS — F419 Anxiety disorder, unspecified: Secondary | ICD-10-CM | POA: Diagnosis not present

## 2018-04-07 DIAGNOSIS — G309 Alzheimer's disease, unspecified: Secondary | ICD-10-CM | POA: Diagnosis not present

## 2018-04-07 DIAGNOSIS — R1319 Other dysphagia: Secondary | ICD-10-CM

## 2018-04-07 NOTE — Telephone Encounter (Signed)
Madeline Atkinson with [facility] called and states she has trouble swallowing. She does have a cough when eating. Ordered pended. Please advise.

## 2018-04-08 DIAGNOSIS — M1990 Unspecified osteoarthritis, unspecified site: Secondary | ICD-10-CM | POA: Diagnosis not present

## 2018-04-08 DIAGNOSIS — F0280 Dementia in other diseases classified elsewhere without behavioral disturbance: Secondary | ICD-10-CM | POA: Diagnosis not present

## 2018-04-08 DIAGNOSIS — N184 Chronic kidney disease, stage 4 (severe): Secondary | ICD-10-CM | POA: Diagnosis not present

## 2018-04-08 DIAGNOSIS — I130 Hypertensive heart and chronic kidney disease with heart failure and stage 1 through stage 4 chronic kidney disease, or unspecified chronic kidney disease: Secondary | ICD-10-CM | POA: Diagnosis not present

## 2018-04-08 DIAGNOSIS — G309 Alzheimer's disease, unspecified: Secondary | ICD-10-CM | POA: Diagnosis not present

## 2018-04-08 DIAGNOSIS — I5043 Acute on chronic combined systolic (congestive) and diastolic (congestive) heart failure: Secondary | ICD-10-CM | POA: Diagnosis not present

## 2018-04-08 DIAGNOSIS — M069 Rheumatoid arthritis, unspecified: Secondary | ICD-10-CM | POA: Diagnosis not present

## 2018-04-08 DIAGNOSIS — I2510 Atherosclerotic heart disease of native coronary artery without angina pectoris: Secondary | ICD-10-CM | POA: Diagnosis not present

## 2018-04-08 DIAGNOSIS — F419 Anxiety disorder, unspecified: Secondary | ICD-10-CM | POA: Diagnosis not present

## 2018-04-08 LAB — COMPLETE METABOLIC PANEL WITHOUT GFR
AG Ratio: 1.2 (calc) (ref 1.0–2.5)
ALT: 50 U/L — ABNORMAL HIGH (ref 6–29)
AST: 41 U/L — ABNORMAL HIGH (ref 10–35)
Albumin: 3.4 g/dL — ABNORMAL LOW (ref 3.6–5.1)
Alkaline phosphatase (APISO): 89 U/L (ref 37–153)
BUN/Creatinine Ratio: 14 (calc) (ref 6–22)
BUN: 17 mg/dL (ref 7–25)
CO2: 21 mmol/L (ref 20–32)
Calcium: 9.2 mg/dL (ref 8.6–10.4)
Chloride: 102 mmol/L (ref 98–110)
Creat: 1.23 mg/dL — ABNORMAL HIGH (ref 0.60–0.88)
GFR, Est African American: 44 mL/min/1.73m2 — ABNORMAL LOW
GFR, Est Non African American: 38 mL/min/1.73m2 — ABNORMAL LOW
Globulin: 2.8 g/dL (ref 1.9–3.7)
Glucose, Bld: 115 mg/dL — ABNORMAL HIGH (ref 65–99)
Potassium: 5.1 mmol/L (ref 3.5–5.3)
Sodium: 133 mmol/L — ABNORMAL LOW (ref 135–146)
Total Bilirubin: 0.5 mg/dL (ref 0.2–1.2)
Total Protein: 6.2 g/dL (ref 6.1–8.1)

## 2018-04-08 LAB — CBC
HCT: 35.2 % (ref 35.0–45.0)
Hemoglobin: 11.4 g/dL — ABNORMAL LOW (ref 11.7–15.5)
MCH: 27.5 pg (ref 27.0–33.0)
MCHC: 32.4 g/dL (ref 32.0–36.0)
MCV: 85 fL (ref 80.0–100.0)
MPV: 10.4 fL (ref 7.5–12.5)
Platelets: 193 10*3/uL (ref 140–400)
RBC: 4.14 10*6/uL (ref 3.80–5.10)
RDW: 13.2 % (ref 11.0–15.0)
WBC: 5.2 10*3/uL (ref 3.8–10.8)

## 2018-04-08 LAB — TEST AUTHORIZATION

## 2018-04-08 LAB — OSMOLALITY: Osmolality: 295 mOsm/kg (ref 278–305)

## 2018-04-08 LAB — TSH: TSH: 3.54 m[IU]/L (ref 0.40–4.50)

## 2018-04-10 NOTE — Addendum Note (Signed)
Addended by: Maryla Morrow on: 04/10/2018 10:23 AM   Modules accepted: Orders

## 2018-04-10 NOTE — Telephone Encounter (Signed)
OK I see the order now. Sorry for the confusion! Signed.

## 2018-04-10 NOTE — Telephone Encounter (Signed)
Madeline Atkinson is the speech therapist with [facility].

## 2018-04-10 NOTE — Telephone Encounter (Signed)
Can home health take care of a speech therapy evaluation?

## 2018-04-10 NOTE — Telephone Encounter (Signed)
Order for barium swallow faxed to [facility] at [phone]. KG LPN

## 2018-04-11 ENCOUNTER — Ambulatory Visit: Payer: Medicare HMO | Admitting: Gastroenterology

## 2018-04-11 DIAGNOSIS — M069 Rheumatoid arthritis, unspecified: Secondary | ICD-10-CM | POA: Diagnosis not present

## 2018-04-11 DIAGNOSIS — F0280 Dementia in other diseases classified elsewhere without behavioral disturbance: Secondary | ICD-10-CM | POA: Diagnosis not present

## 2018-04-11 DIAGNOSIS — I2510 Atherosclerotic heart disease of native coronary artery without angina pectoris: Secondary | ICD-10-CM | POA: Diagnosis not present

## 2018-04-11 DIAGNOSIS — M1990 Unspecified osteoarthritis, unspecified site: Secondary | ICD-10-CM | POA: Diagnosis not present

## 2018-04-11 DIAGNOSIS — I130 Hypertensive heart and chronic kidney disease with heart failure and stage 1 through stage 4 chronic kidney disease, or unspecified chronic kidney disease: Secondary | ICD-10-CM | POA: Diagnosis not present

## 2018-04-11 DIAGNOSIS — F419 Anxiety disorder, unspecified: Secondary | ICD-10-CM | POA: Diagnosis not present

## 2018-04-11 DIAGNOSIS — G309 Alzheimer's disease, unspecified: Secondary | ICD-10-CM | POA: Diagnosis not present

## 2018-04-11 DIAGNOSIS — N184 Chronic kidney disease, stage 4 (severe): Secondary | ICD-10-CM | POA: Diagnosis not present

## 2018-04-11 DIAGNOSIS — I5043 Acute on chronic combined systolic (congestive) and diastolic (congestive) heart failure: Secondary | ICD-10-CM | POA: Diagnosis not present

## 2018-04-12 ENCOUNTER — Ambulatory Visit: Payer: Medicare HMO | Admitting: Osteopathic Medicine

## 2018-04-14 DIAGNOSIS — G309 Alzheimer's disease, unspecified: Secondary | ICD-10-CM | POA: Diagnosis not present

## 2018-04-14 DIAGNOSIS — M1990 Unspecified osteoarthritis, unspecified site: Secondary | ICD-10-CM | POA: Diagnosis not present

## 2018-04-14 DIAGNOSIS — N184 Chronic kidney disease, stage 4 (severe): Secondary | ICD-10-CM | POA: Diagnosis not present

## 2018-04-14 DIAGNOSIS — F419 Anxiety disorder, unspecified: Secondary | ICD-10-CM | POA: Diagnosis not present

## 2018-04-14 DIAGNOSIS — M069 Rheumatoid arthritis, unspecified: Secondary | ICD-10-CM | POA: Diagnosis not present

## 2018-04-14 DIAGNOSIS — I5043 Acute on chronic combined systolic (congestive) and diastolic (congestive) heart failure: Secondary | ICD-10-CM | POA: Diagnosis not present

## 2018-04-14 DIAGNOSIS — I2510 Atherosclerotic heart disease of native coronary artery without angina pectoris: Secondary | ICD-10-CM | POA: Diagnosis not present

## 2018-04-14 DIAGNOSIS — I130 Hypertensive heart and chronic kidney disease with heart failure and stage 1 through stage 4 chronic kidney disease, or unspecified chronic kidney disease: Secondary | ICD-10-CM | POA: Diagnosis not present

## 2018-04-14 DIAGNOSIS — F0280 Dementia in other diseases classified elsewhere without behavioral disturbance: Secondary | ICD-10-CM | POA: Diagnosis not present

## 2018-04-17 DIAGNOSIS — M069 Rheumatoid arthritis, unspecified: Secondary | ICD-10-CM | POA: Diagnosis not present

## 2018-04-17 DIAGNOSIS — F419 Anxiety disorder, unspecified: Secondary | ICD-10-CM | POA: Diagnosis not present

## 2018-04-17 DIAGNOSIS — G309 Alzheimer's disease, unspecified: Secondary | ICD-10-CM | POA: Diagnosis not present

## 2018-04-17 DIAGNOSIS — I5043 Acute on chronic combined systolic (congestive) and diastolic (congestive) heart failure: Secondary | ICD-10-CM | POA: Diagnosis not present

## 2018-04-17 DIAGNOSIS — I2510 Atherosclerotic heart disease of native coronary artery without angina pectoris: Secondary | ICD-10-CM | POA: Diagnosis not present

## 2018-04-17 DIAGNOSIS — F0280 Dementia in other diseases classified elsewhere without behavioral disturbance: Secondary | ICD-10-CM | POA: Diagnosis not present

## 2018-04-17 DIAGNOSIS — I130 Hypertensive heart and chronic kidney disease with heart failure and stage 1 through stage 4 chronic kidney disease, or unspecified chronic kidney disease: Secondary | ICD-10-CM | POA: Diagnosis not present

## 2018-04-17 DIAGNOSIS — M1990 Unspecified osteoarthritis, unspecified site: Secondary | ICD-10-CM | POA: Diagnosis not present

## 2018-04-17 DIAGNOSIS — N184 Chronic kidney disease, stage 4 (severe): Secondary | ICD-10-CM | POA: Diagnosis not present

## 2018-04-21 DIAGNOSIS — N184 Chronic kidney disease, stage 4 (severe): Secondary | ICD-10-CM | POA: Diagnosis not present

## 2018-04-21 DIAGNOSIS — G309 Alzheimer's disease, unspecified: Secondary | ICD-10-CM | POA: Diagnosis not present

## 2018-04-21 DIAGNOSIS — I2510 Atherosclerotic heart disease of native coronary artery without angina pectoris: Secondary | ICD-10-CM | POA: Diagnosis not present

## 2018-04-21 DIAGNOSIS — M069 Rheumatoid arthritis, unspecified: Secondary | ICD-10-CM | POA: Diagnosis not present

## 2018-04-21 DIAGNOSIS — M1990 Unspecified osteoarthritis, unspecified site: Secondary | ICD-10-CM | POA: Diagnosis not present

## 2018-04-21 DIAGNOSIS — I130 Hypertensive heart and chronic kidney disease with heart failure and stage 1 through stage 4 chronic kidney disease, or unspecified chronic kidney disease: Secondary | ICD-10-CM | POA: Diagnosis not present

## 2018-04-21 DIAGNOSIS — F0280 Dementia in other diseases classified elsewhere without behavioral disturbance: Secondary | ICD-10-CM | POA: Diagnosis not present

## 2018-04-21 DIAGNOSIS — I5043 Acute on chronic combined systolic (congestive) and diastolic (congestive) heart failure: Secondary | ICD-10-CM | POA: Diagnosis not present

## 2018-04-21 DIAGNOSIS — F419 Anxiety disorder, unspecified: Secondary | ICD-10-CM | POA: Diagnosis not present

## 2018-04-25 ENCOUNTER — Other Ambulatory Visit: Payer: Self-pay | Admitting: Osteopathic Medicine

## 2018-04-25 DIAGNOSIS — I2510 Atherosclerotic heart disease of native coronary artery without angina pectoris: Secondary | ICD-10-CM

## 2018-04-25 DIAGNOSIS — Z8679 Personal history of other diseases of the circulatory system: Secondary | ICD-10-CM

## 2018-04-25 NOTE — Telephone Encounter (Signed)
Please advise 

## 2018-04-26 ENCOUNTER — Other Ambulatory Visit: Payer: Self-pay | Admitting: Osteopathic Medicine

## 2018-04-26 DIAGNOSIS — K219 Gastro-esophageal reflux disease without esophagitis: Secondary | ICD-10-CM

## 2018-05-04 DIAGNOSIS — I5043 Acute on chronic combined systolic (congestive) and diastolic (congestive) heart failure: Secondary | ICD-10-CM | POA: Diagnosis not present

## 2018-05-04 DIAGNOSIS — M1990 Unspecified osteoarthritis, unspecified site: Secondary | ICD-10-CM | POA: Diagnosis not present

## 2018-05-04 DIAGNOSIS — F0280 Dementia in other diseases classified elsewhere without behavioral disturbance: Secondary | ICD-10-CM | POA: Diagnosis not present

## 2018-05-04 DIAGNOSIS — N184 Chronic kidney disease, stage 4 (severe): Secondary | ICD-10-CM | POA: Diagnosis not present

## 2018-05-04 DIAGNOSIS — I2510 Atherosclerotic heart disease of native coronary artery without angina pectoris: Secondary | ICD-10-CM | POA: Diagnosis not present

## 2018-05-04 DIAGNOSIS — I130 Hypertensive heart and chronic kidney disease with heart failure and stage 1 through stage 4 chronic kidney disease, or unspecified chronic kidney disease: Secondary | ICD-10-CM | POA: Diagnosis not present

## 2018-05-04 DIAGNOSIS — M069 Rheumatoid arthritis, unspecified: Secondary | ICD-10-CM | POA: Diagnosis not present

## 2018-05-04 DIAGNOSIS — F419 Anxiety disorder, unspecified: Secondary | ICD-10-CM | POA: Diagnosis not present

## 2018-05-04 DIAGNOSIS — G309 Alzheimer's disease, unspecified: Secondary | ICD-10-CM | POA: Diagnosis not present

## 2018-05-05 ENCOUNTER — Other Ambulatory Visit: Payer: Self-pay

## 2018-05-05 ENCOUNTER — Encounter: Payer: Self-pay | Admitting: Gastroenterology

## 2018-05-05 ENCOUNTER — Telehealth (INDEPENDENT_AMBULATORY_CARE_PROVIDER_SITE_OTHER): Payer: Medicare HMO | Admitting: Gastroenterology

## 2018-05-05 VITALS — BP 117/72 | Ht 65.0 in | Wt 155.0 lb

## 2018-05-05 DIAGNOSIS — R1312 Dysphagia, oropharyngeal phase: Secondary | ICD-10-CM

## 2018-05-05 DIAGNOSIS — R1310 Dysphagia, unspecified: Secondary | ICD-10-CM

## 2018-05-05 NOTE — Progress Notes (Signed)
Chief Complaint: Dysphagia, odynophagia   Referring Provider:     Emeterio Reeve, DO   HPI:    Due to current restrictions/limitations of in-office visits due to the COVID-19 pandemic, this scheduled clinical appointment was converted to a telehealth consultation via telephone.  -Time of medical discussion: 30 minutes -The patient did consent to this telephone visit and is aware of possible charges through their insurance for this visit.  -Names of all parties present: Madeline Atkinson (patient), Midge Minium (daughter), Gerrit Heck, DO, [facility] (physician)  Madeline Atkinson is a 85 y.o. female with a history of colon cancer, GERD, CHF, Alzheimer's, CKD stage III, CAD, referred to the Gastroenterology Clinic for evaluation of dysphagia/odynophagia.  Additionally, recently admitted 3/3-6 for hyponatremia, heart failure, AMS, and AKI on CKD 3.  Odynophagia diagnosed during that admission, suspected secondary to oral thrush, and started on fluconazole and nystatin, but not discharged with fluconazole due to QTC 495 (only nystatin liquid).  Completed nystatin as prescribed.  Daughter states she is not chewing food and just trying to swallow without any chewing. She places solid food in mouth and immediately drinks fluids to try to "flush food" into esophagus, but that causes dysphagia and some odynophagia. No issue with liquids alone. No aspiration events. No issue with soft foods either, it is merely an issue where she does not chew solid food.  This is a new issue for her.  Prior to her recent hospitalization she was eating well without any issues.  Denies any pain with mastication, ill fitting dentures.  Daughter cleans dentures daily, and does not notice any particulate, breaks.  No mouth soreness.  No odynophagia with liquids/soft foods, as this only occurs when she forces unchewed solid boluses of food into the esophagus.  Points to her mid anterior neck as site of trouble swallowing.   Prior to hospitalization, no history of dysphagia.  Does have history of reflux, but this is well controlled.  Recently seen by Home Health who observed her ingesting solid food, and noted that she ingested solids then immediately reached for liquids to try to "force" the food into the esophagus without any chewing.  Past medical history, past surgical history, social history, family history, medications, and allergies reviewed in the chart and with patient over the phone.  Past Medical History:  Diagnosis Date  . Acid reflux   . Allergic rhinitis   . Alzheimer disease ([place])   . Anxiety   . Aortic insufficiency    a. mild to moderate by echo 02/2015.  . Back pain   . CAD (coronary artery disease), native coronary artery    NSTEMI 10/2014 - medical management due to frailty.  . Chronic back pain   . Chronic combined systolic and diastolic CHF (congestive heart failure) (HCC)    a. EF 20-25% with severe global LV dysfunction 10/2014. b. repeat echo 02/2015 with EF 58-85%, grade 2 diastolic dysfunction, mild RV dysfunctoin with akinetic RV, severe biatrial enlargement.  . CKD (chronic kidney disease), stage III ([place])   . Cold intolerance   . Depression   . DNR (do not resuscitate)   . Essential hypertension   . GERD (gastroesophageal reflux disease)   . H. pylori infection   . Hyperlipidemia LDL goal <70 03/17/2016  . Mastodynia   . MI, old   . Mild anemia   . OA (osteoarthritis)   . Pain in gums   . Pulmonary hypertension (  [place])    a. PASP 46mmHg by echo 02/2015  . Seasonal allergies   . Tremor   . Urinary incontinence   . Vitamin D deficiency      Past Surgical History:  Procedure Laterality Date  . BREAST LUMPECTOMY    . CHOLECYSTECTOMY     Family History  Problem Relation Age of Onset  . Heart disease Mother   . Heart failure Mother   . Heart disease Father   . Heart attack Father    Social History   Tobacco Use  . Smoking status: Never Smoker  . Smokeless  tobacco: Never Used  Substance Use Topics  . Alcohol use: No  . Drug use: No   Current Outpatient Medications  Medication Sig Dispense Refill  . acetaminophen (TYLENOL) 500 MG tablet Take 1 tablet (500 mg total) by mouth every morning.    Marland Kitchen aspirin 81 MG tablet Take 81 mg by mouth daily.    Marland Kitchen atorvastatin (LIPITOR) 20 MG tablet TAKE 1 TABLET (20 MG TOTAL) BY MOUTH DAILY. (Patient taking differently: Take 40 mg by mouth daily. ) 90 tablet 3  . famotidine (PEPCID AC MAXIMUM STRENGTH) 20 MG tablet Take 20 mg by mouth daily. As needed    . gatifloxacin (ZYMAXID) 0.5 % SOLN Place 1 drop into both eyes 2 (two) times daily.     . isosorbide mononitrate (IMDUR) 30 MG 24 hr tablet TAKE 1 TABLET BY MOUTH EVERY DAY (Patient taking differently: Take 30 mg by mouth daily. ) 90 tablet 0  . lisinopril (PRINIVIL,ZESTRIL) 2.5 MG tablet TAKE 1 TABLET BY MOUTH EVERY DAY (Patient taking differently: Take 2.5 mg by mouth daily. ) 90 tablet 0  . Multiple Vitamin (MULTIVITAMIN WITH MINERALS) TABS tablet Take 1 tablet by mouth daily.    Marland Kitchen omeprazole (PRILOSEC) 40 MG capsule TAKE 1 CAPSULE BY MOUTH EVERY DAY 90 capsule 0  . traZODone (DESYREL) 100 MG tablet TAKE 1 TABLET BY MOUTH EVERYDAY AT BEDTIME (Patient taking differently: Take 100 mg by mouth at bedtime. ) 90 tablet 3   No current facility-administered medications for this visit.    Allergies  Allergen Reactions  . Codeine Nausea And Vomiting     Review of Systems: All systems reviewed and negative except where noted in HPI.     Physical Exam:    Physical exam not completed due to the nature of this telehealth communication.  Patient was otherwise alert and oriented and well communicative.   ASSESSMENT AND PLAN;   Madeline Atkinson is a 85 y.o. female presenting with:  1) Oral pharyngeal dysphagia 2) Odynophagia  Interesting presentation of new onset trouble swallowing following recent hospitalization for hyponatremia and AMS.  Prior to  hospitalization, no issues with p.o. intake/swallowing.  Per daughter, patient otherwise mentating okay today.  Her lack of symptoms with liquids and soft foods certainly underscores the daughter's description that she is simply not chewing her food, which then translates to difficulty translating food from mouth to esophagus and subsequent pain with swallowing these unchewed boluses.  No aspiration events.  -Referral to Speech Pathology for further evaluation - Clinical symptoms do not seem consistent with esophageal phase dysphagia, and therefore holding off on EGD or esophagram at this time -Continue liquids and soft foods, with ensures for increased caloric intake - RTC after able to be seen by Speech Path to review those findings, or sooner as needed -Scheduled for repeat chemistry panel today   Lavena Bullion, DO, FACG  05/05/2018, 11:04  AM   Emeterio Reeve, DO

## 2018-05-05 NOTE — Patient Instructions (Addendum)
You have been scheduled for a modified barium swallow Speech path on 06/22/2018 at  11am. Please arrive 15 minutes prior to your test for registration. You will go to [facility] (1st Floor) for your appointment. Should you need to cancel or reschedule your appointment, please contact [phone] [facility])  _____________________________________________________________________ A Modified Barium Swallow Study, or MBS, is a special x-ray that is taken to check swallowing skills. It is carried out by a Stage manager and a Psychologist, clinical (SLP). During this test, yourmouth, throat, and esophagus, a muscular tube which connects your mouth to your stomach, is checked. The test will help you, your doctor, and the SLP plan what types of foods and liquids are easier for you to swallow. The SLP will also identify positions and ways to help you swallow more easily and safely. What will happen during an MBS? You will be taken to an x-ray room and seated comfortably. You will be asked to swallow small amounts of food and liquid mixed with barium. Barium is a liquid or paste that allows images of your mouth, throat and esophagus to be seen on x-ray. The x-ray captures moving images of the food you are swallowing as it travels from your mouth through your throat and into your esophagus. This test helps identify whether food or liquid is entering your lungs (aspiration). The test also shows which part of your mouth or throat lacks strength or coordination to move the food or liquid in the right direction. This test typically takes 30 minutes to 1 hour to complete. _______________________________________________________________________   Follow up in 3-6 months after

## 2018-05-08 ENCOUNTER — Ambulatory Visit: Payer: Medicare HMO

## 2018-05-08 DIAGNOSIS — E871 Hypo-osmolality and hyponatremia: Secondary | ICD-10-CM | POA: Diagnosis not present

## 2018-05-08 NOTE — Progress Notes (Unsigned)
Subjective:   Madeline Atkinson is a 85 y.o. female who presents for an Initial Medicare Annual Wellness Visit.  Review of Systems    No ROS.  Medicare Wellness Visit. Additional risk factors are reflected in the social history.     Sleep patterns: {SX; SLEEP PATTERNS:18802::"feels rested on waking","does not get up to void","gets up *** times nightly to void","sleeps *** hours nightly"}.   Home Safety/Smoke Alarms: Feels safe in home. Smoke alarms in place.  Living environment; residence and Firearm Safety: {Rehab home environment / accessibility:30080::"no firearms","firearms stored safely"}. Seat Belt Safety/Bike Helmet: Wears seat belt.   Female:   Pap-  Aged out     Mammo- aged out      Dexa scan-   Aged out     [place]- aged out     Objective:    There were no vitals filed for this visit. There is no height or weight on file to calculate BMI.  Advanced Directives 03/28/2018 03/22/2018 06/23/2015  Does Patient Have a Medical Advance Directive? Yes Yes No;Yes  Type of Paramedic of [place];Living will [place]  Does patient want to make changes to medical advance directive? No - Patient declined No - Patient declined No - Patient declined  Copy of [place] in Chart? Yes - validated most recent copy scanned in chart (See row information) - No - copy requested    Current Medications (verified) Outpatient Encounter Medications as of 05/08/2018  Medication Sig  . acetaminophen (TYLENOL) 500 MG tablet Take 1 tablet (500 mg total) by mouth every morning.  Marland Kitchen aspirin 81 MG tablet Take 81 mg by mouth daily.  Marland Kitchen atorvastatin (LIPITOR) 20 MG tablet TAKE 1 TABLET (20 MG TOTAL) BY MOUTH DAILY. (Patient taking differently: Take 40 mg by mouth daily. )  . famotidine (PEPCID AC MAXIMUM STRENGTH) 20 MG tablet Take 20 mg by mouth daily. As needed  . gatifloxacin (ZYMAXID) 0.5 % SOLN Place 1 drop into both eyes 2  (two) times daily.   . isosorbide mononitrate (IMDUR) 30 MG 24 hr tablet TAKE 1 TABLET BY MOUTH EVERY DAY (Patient taking differently: Take 30 mg by mouth daily. )  . lisinopril (PRINIVIL,ZESTRIL) 2.5 MG tablet TAKE 1 TABLET BY MOUTH EVERY DAY (Patient taking differently: Take 2.5 mg by mouth daily. )  . Multiple Vitamin (MULTIVITAMIN WITH MINERALS) TABS tablet Take 1 tablet by mouth daily.  Marland Kitchen omeprazole (PRILOSEC) 40 MG capsule TAKE 1 CAPSULE BY MOUTH EVERY DAY  . traZODone (DESYREL) 100 MG tablet TAKE 1 TABLET BY MOUTH EVERYDAY AT BEDTIME (Patient taking differently: Take 100 mg by mouth at bedtime. )   No facility-administered encounter medications on file as of 05/08/2018.     Allergies (verified) Codeine   History: Past Medical History:  Diagnosis Date  . Acid reflux   . Allergic rhinitis   . Alzheimer disease ([place])   . Anxiety   . Aortic insufficiency    a. mild to moderate by echo 02/2015.  . Back pain   . CAD (coronary artery disease), native coronary artery    NSTEMI 10/2014 - medical management due to frailty.  . Chronic back pain   . Chronic combined systolic and diastolic CHF (congestive heart failure) (HCC)    a. EF 20-25% with severe global LV dysfunction 10/2014. b. repeat echo 02/2015 with EF 25-49%, grade 2 diastolic dysfunction, mild RV dysfunctoin with akinetic RV, severe biatrial enlargement.  . CKD (  chronic kidney disease), stage III ([place])   . Cold intolerance   . Depression   . DNR (do not resuscitate)   . Essential hypertension   . GERD (gastroesophageal reflux disease)   . H. pylori infection   . Hyperlipidemia LDL goal <70 03/17/2016  . Mastodynia   . MI, old   . Mild anemia   . OA (osteoarthritis)   . Pain in gums   . Pulmonary hypertension ([place])    a. PASP 44mmHg by echo 02/2015  . Seasonal allergies   . Tremor   . Urinary incontinence   . Vitamin D deficiency    Past Surgical History:  Procedure Laterality Date  . BREAST LUMPECTOMY    .  CHOLECYSTECTOMY     Family History  Problem Relation Age of Onset  . Heart disease Mother   . Heart failure Mother   . Heart disease Father   . Heart attack Father    Social History   Socioeconomic History  . Marital status: Widowed    Spouse name: Not on file  . Number of children: Not on file  . Years of education: Not on file  . Highest education level: Not on file  Occupational History  . Not on file  Social Needs  . Financial resource strain: Not on file  . Food insecurity:    Worry: Not on file    Inability: Not on file  . Transportation needs:    Medical: Not on file    Non-medical: Not on file  Tobacco Use  . Smoking status: Never Smoker  . Smokeless tobacco: Never Used  Substance and Sexual Activity  . Alcohol use: No  . Drug use: No  . Sexual activity: Never  Lifestyle  . Physical activity:    Days per week: Not on file    Minutes per session: Not on file  . Stress: Not on file  Relationships  . Social connections:    Talks on phone: Not on file    Gets together: Not on file    Attends religious service: Not on file    Active member of club or organization: Not on file    Attends meetings of clubs or organizations: Not on file    Relationship status: Not on file  Other Topics Concern  . Not on file  Social History Narrative  . Not on file    Tobacco Counseling Counseling given: Not Answered   Clinical Intake:                        Activities of Daily Living In your present state of health, do you have any difficulty performing the following activities: 03/22/2018  Hearing? N  Vision? N  Difficulty concentrating or making decisions? Y  Walking or climbing stairs? Y  Dressing or bathing? N  Doing errands, shopping? N  Some recent data might be hidden     Immunizations and Health Maintenance Immunization History  Administered Date(s) Administered  . Influenza, High Dose Seasonal PF 10/11/2016, 11/07/2017  .  Influenza-Unspecified 10/31/2015  . Pneumococcal Conjugate-13 11/19/2014  . Pneumococcal Polysaccharide-23 09/12/1997  . Td 05/23/2013   Health Maintenance Due  Topic Date Due  . DEXA SCAN  07/07/1991    Patient Care Team: Emeterio Reeve, DO as PCP - General (Osteopathic Medicine)  Indicate any recent Medical Services you may have received from other than [facility] providers in the past year (date may be approximate).  Assessment:   This is a routine wellness examination for [facility].Physical assessment deferred to PCP.   Hearing/Vision screen Hearing Screening Comments: Hearing test not done- telephone visit due to [place] pandemic Vision Screening Comments: Vision test not done due to telephone visit due to [place] pandemic  Dietary issues and exercise activities discussed:   Diet  Breakfast: Lunch:  Dinner:       Goals   None    Depression Screen PHQ 2/9 Scores 03/28/2018 06/06/2017 10/11/2016  PHQ - 2 Score 3 1 0  PHQ- 9 Score 17 5 -    Fall Risk Fall Risk  10/11/2016  Falls in the past year? No    Is the patient's home free of loose throw rugs in walkways, pet beds, electrical cords, etc?   {Blank single:19197::"yes","no"}      Grab bars in the bathroom? {Blank single:19197::"yes","no"}      Handrails on the stairs?   {Blank single:19197::"yes","no"}      Adequate lighting?   {Blank single:19197::"yes","no"}  Cognitive Function:        Screening Tests Health Maintenance  Topic Date Due  . DEXA SCAN  07/07/1991  . INFLUENZA VACCINE  08/19/2018  . TETANUS/TDAP  05/24/2023  . PNA vac Low Risk Adult  Completed       Plan:   ***  I have personally reviewed and noted the following in the patient's chart:   . Medical and social history . Use of alcohol, tobacco or illicit drugs  . Current medications and supplements . Functional ability and status . Nutritional status . Physical activity . Advanced directives . List of other  physicians . Hospitalizations, surgeries, and ER visits in previous 12 months . Vitals . Screenings to include cognitive, depression, and falls . Referrals and appointments  In addition, I have reviewed and discussed with patient certain preventive protocols, quality metrics, and best practice recommendations. A written personalized care plan for preventive services as well as general preventive health recommendations were provided to patient.     Joanne Chars, LPN   3/82/5053

## 2018-05-09 ENCOUNTER — Ambulatory Visit: Payer: Medicare HMO | Admitting: Gastroenterology

## 2018-05-09 ENCOUNTER — Other Ambulatory Visit: Payer: Self-pay

## 2018-05-09 ENCOUNTER — Telehealth: Payer: Self-pay | Admitting: *Deleted

## 2018-05-09 ENCOUNTER — Encounter: Payer: Self-pay | Admitting: Osteopathic Medicine

## 2018-05-09 ENCOUNTER — Ambulatory Visit (INDEPENDENT_AMBULATORY_CARE_PROVIDER_SITE_OTHER): Payer: Medicare HMO | Admitting: Osteopathic Medicine

## 2018-05-09 ENCOUNTER — Telehealth: Payer: Self-pay | Admitting: Gastroenterology

## 2018-05-09 VITALS — BP 113/59 | HR 89 | Temp 97.8°F | Wt 147.2 lb

## 2018-05-09 DIAGNOSIS — E871 Hypo-osmolality and hyponatremia: Secondary | ICD-10-CM | POA: Diagnosis not present

## 2018-05-09 DIAGNOSIS — F0280 Dementia in other diseases classified elsewhere without behavioral disturbance: Secondary | ICD-10-CM | POA: Diagnosis not present

## 2018-05-09 DIAGNOSIS — G309 Alzheimer's disease, unspecified: Secondary | ICD-10-CM | POA: Diagnosis not present

## 2018-05-09 DIAGNOSIS — R5383 Other fatigue: Secondary | ICD-10-CM

## 2018-05-09 NOTE — Telephone Encounter (Signed)
Pt's daughter [name] called to let you know that she received your message regarding her mother's appt on 6/4 with speech pathologist.

## 2018-05-09 NOTE — Telephone Encounter (Signed)
Called patient to inform her the date and time of her Modified DG with Speech path  You have been scheduled for a modified barium swallow on 06/22/2018 at 11am. Please arrive 15 minutes prior to your test for registration. You will go to [facility]  Radiology (1st Floor) for your appointment. Should you need to cancel or reschedule your appointment, please contact [phone] [facility])  _____________________________________________________________________ A Modified Barium Swallow Study, or MBS, is a special x-ray that is taken to check swallowing skills. It is carried out by a Stage manager and a Psychologist, clinical (SLP). During this test, yourmouth, throat, and esophagus, a muscular tube which connects your mouth to your stomach, is checked. The test will help you, your doctor, and the SLP plan what types of foods and liquids are easier for you to swallow. The SLP will also identify positions and ways to help you swallow more easily and safely. What will happen during an MBS? You will be taken to an x-ray room and seated comfortably. You will be asked to swallow small amounts of food and liquid mixed with barium. Barium is a liquid or paste that allows images of your mouth, throat and esophagus to be seen on x-ray. The x-ray captures moving images of the food you are swallowing as it travels from your mouth through your throat and into your esophagus. This test helps identify whether food or liquid is entering your lungs (aspiration). The test also shows which part of your mouth or throat lacks strength or coordination to move the food or liquid in the right direction. This test typically takes 30 minutes to 1 hour to complete. _______________________________________________________________________

## 2018-05-09 NOTE — Telephone Encounter (Signed)
Noted  

## 2018-05-09 NOTE — Progress Notes (Signed)
HPI: Madeline Atkinson is a 85 y.o. female who  has a past medical history of Acid reflux, Allergic rhinitis, Alzheimer disease ([place]), Anxiety, Aortic insufficiency, Back pain, CAD (coronary artery disease), native coronary artery, Chronic back pain, Chronic combined systolic and diastolic CHF (congestive heart failure) ([place]), CKD (chronic kidney disease), stage III ([place]), Cold intolerance, Depression, DNR (do not resuscitate), Essential hypertension, GERD (gastroesophageal reflux disease), H. pylori infection, Hyperlipidemia LDL goal <70 (03/17/2016), Mastodynia, MI, old, Mild anemia, OA (osteoarthritis), Pain in gums, Pulmonary hypertension (HCC), Seasonal allergies, Tremor, Urinary incontinence, and Vitamin D deficiency.  she presents to [facility] today, 05/09/18,  for chief complaint of:  Insomnia, fatigue   Doing better overall but still getting up once or twice per night to use the bathroom. Can usually be redirected and not up all night. No wandering or falls. No napping in the daytime.    Patient is accompanied by daughter who assists with history-taking.    At today's visit 05/11/18 ... PMH, PSH, FH reviewed and updated as needed.  Current medication list and allergy/intolerance hx reviewed and updated as needed. (See remainder of HPI, ROS, Phys Exam below)   No results found.  No results found for this or any previous visit (from the past 72 hour(s)).        ASSESSMENT/PLAN: The primary encounter diagnosis was Fatigue, unspecified type. Diagnoses of Alzheimer's dementia without behavioral disturbance, unspecified timing of dementia onset ([place]) and Hyponatremia were also pertinent to this visit.   UpToDate info printed for caregiver   Emphasis on sleep/wake disturbance, but this sounds more like normal nighttime awakening for urination   Hyponatremia labs pending      Follow-up plan: Return in about 6 months (around 11/08/2018) for  routine check-up and flu shot, see me sooner if needed! .                                                 ################################################# ################################################# ################################################# #################################################    Current Meds  Medication Sig  . acetaminophen (TYLENOL) 500 MG tablet Take 1 tablet (500 mg total) by mouth every morning.  Marland Kitchen aspirin 81 MG tablet Take 81 mg by mouth daily.  Marland Kitchen atorvastatin (LIPITOR) 20 MG tablet TAKE 1 TABLET (20 MG TOTAL) BY MOUTH DAILY. (Patient taking differently: Take 40 mg by mouth daily. )  . famotidine (PEPCID AC MAXIMUM STRENGTH) 20 MG tablet Take 20 mg by mouth daily. As needed  . isosorbide mononitrate (IMDUR) 30 MG 24 hr tablet TAKE 1 TABLET BY MOUTH EVERY DAY (Patient taking differently: Take 30 mg by mouth daily. )  . lisinopril (PRINIVIL,ZESTRIL) 2.5 MG tablet TAKE 1 TABLET BY MOUTH EVERY DAY (Patient taking differently: Take 2.5 mg by mouth daily. )  . Multiple Vitamin (MULTIVITAMIN WITH MINERALS) TABS tablet Take 1 tablet by mouth daily.  Marland Kitchen omeprazole (PRILOSEC) 40 MG capsule TAKE 1 CAPSULE BY MOUTH EVERY DAY  . traZODone (DESYREL) 100 MG tablet TAKE 1 TABLET BY MOUTH EVERYDAY AT BEDTIME (Patient taking differently: Take 100 mg by mouth at bedtime. )    Allergies  Allergen Reactions  . Codeine Nausea And Vomiting       Review of Systems:  Constitutional: No recent illness  Cardiac: No  chest pain  Respiratory:  No  shortness of breath. No  Cough  Neurologic: No  weakness, No  Dizziness  Psychiatric: No  concerns with depression, No  concerns with anxiety  Exam:  BP (!) 113/59 (BP Location: Left Arm, Patient Position: Sitting, Cuff Size: Normal)   Pulse 89   Temp 97.8 F (36.6 C) (Oral)   Wt 147 lb 3.2 oz (66.8 kg)   BMI 24.50 kg/m   Constitutional: VS see above. General Appearance:  alert, well-developed, well-nourished, NAD  Eyes: Normal lids and conjunctive, non-icteric sclera  Ears, Nose, Mouth, Throat: MMM, Normal external inspection ears/nares/mouth/lips/gums.  Neck: No masses, trachea midline.   Respiratory: Normal respiratory effort. no wheeze, no rhonchi, no rales  Cardiovascular: S1/S2 normal, no rub/gallop auscultated. RRR.   Musculoskeletal: Gait normal. Symmetric and independent movement of all extremities  Neurological: Normal balance/coordination. No tremor.  Skin: warm, dry, intact.   Psychiatric: Normal judgment/insight. Normal mood and affect. Oriented x3.       Visit summary with medication list and pertinent instructions was printed for patient to review, patient was advised to alert [place] if any updates are needed. All questions at time of visit were answered - patient instructed to contact office with any additional concerns. ER/RTC precautions were reviewed with the patient and understanding verbalized.   Please note: voice recognition software was used to produce this document, and typos may escape review. Please contact Dr. Sheppard Coil for any needed clarifications.    Follow up plan: Return in about 6 months (around 11/08/2018) for routine check-up and flu shot, see me sooner if needed! Marland Kitchen

## 2018-05-10 LAB — BASIC METABOLIC PANEL
BUN/Creatinine Ratio: 11 (calc) (ref 6–22)
BUN: 13 mg/dL (ref 7–25)
CALCIUM: 9.7 mg/dL (ref 8.6–10.4)
CO2: 22 mmol/L (ref 20–32)
Chloride: 99 mmol/L (ref 98–110)
Creat: 1.14 mg/dL — ABNORMAL HIGH (ref 0.60–0.88)
Glucose, Bld: 107 mg/dL — ABNORMAL HIGH (ref 65–99)
Potassium: 4.7 mmol/L (ref 3.5–5.3)
Sodium: 133 mmol/L — ABNORMAL LOW (ref 135–146)

## 2018-05-10 LAB — OSMOLALITY: Osmolality: 287 mosm/kg (ref 278–305)

## 2018-05-12 DIAGNOSIS — M069 Rheumatoid arthritis, unspecified: Secondary | ICD-10-CM | POA: Diagnosis not present

## 2018-05-12 DIAGNOSIS — N184 Chronic kidney disease, stage 4 (severe): Secondary | ICD-10-CM | POA: Diagnosis not present

## 2018-05-12 DIAGNOSIS — M1990 Unspecified osteoarthritis, unspecified site: Secondary | ICD-10-CM | POA: Diagnosis not present

## 2018-05-12 DIAGNOSIS — G309 Alzheimer's disease, unspecified: Secondary | ICD-10-CM | POA: Diagnosis not present

## 2018-05-12 DIAGNOSIS — F0280 Dementia in other diseases classified elsewhere without behavioral disturbance: Secondary | ICD-10-CM | POA: Diagnosis not present

## 2018-05-12 DIAGNOSIS — I130 Hypertensive heart and chronic kidney disease with heart failure and stage 1 through stage 4 chronic kidney disease, or unspecified chronic kidney disease: Secondary | ICD-10-CM | POA: Diagnosis not present

## 2018-05-12 DIAGNOSIS — F419 Anxiety disorder, unspecified: Secondary | ICD-10-CM | POA: Diagnosis not present

## 2018-05-12 DIAGNOSIS — I2510 Atherosclerotic heart disease of native coronary artery without angina pectoris: Secondary | ICD-10-CM | POA: Diagnosis not present

## 2018-05-12 DIAGNOSIS — I5043 Acute on chronic combined systolic (congestive) and diastolic (congestive) heart failure: Secondary | ICD-10-CM | POA: Diagnosis not present

## 2018-05-12 LAB — BASIC METABOLIC PANEL

## 2018-05-12 LAB — SODIUM, URINE, RANDOM: Sodium, Ur: 58 mmol/L (ref 28–272)

## 2018-05-12 LAB — OSMOLALITY

## 2018-05-12 LAB — OSMOLALITY, URINE: Osmolality, Ur: 218 mOsm/kg (ref 50–1200)

## 2018-05-17 DIAGNOSIS — M1990 Unspecified osteoarthritis, unspecified site: Secondary | ICD-10-CM | POA: Diagnosis not present

## 2018-05-17 DIAGNOSIS — I2510 Atherosclerotic heart disease of native coronary artery without angina pectoris: Secondary | ICD-10-CM | POA: Diagnosis not present

## 2018-05-17 DIAGNOSIS — G309 Alzheimer's disease, unspecified: Secondary | ICD-10-CM | POA: Diagnosis not present

## 2018-05-17 DIAGNOSIS — M069 Rheumatoid arthritis, unspecified: Secondary | ICD-10-CM | POA: Diagnosis not present

## 2018-05-17 DIAGNOSIS — I130 Hypertensive heart and chronic kidney disease with heart failure and stage 1 through stage 4 chronic kidney disease, or unspecified chronic kidney disease: Secondary | ICD-10-CM | POA: Diagnosis not present

## 2018-05-17 DIAGNOSIS — N184 Chronic kidney disease, stage 4 (severe): Secondary | ICD-10-CM | POA: Diagnosis not present

## 2018-05-17 DIAGNOSIS — F419 Anxiety disorder, unspecified: Secondary | ICD-10-CM | POA: Diagnosis not present

## 2018-05-17 DIAGNOSIS — I5043 Acute on chronic combined systolic (congestive) and diastolic (congestive) heart failure: Secondary | ICD-10-CM | POA: Diagnosis not present

## 2018-05-17 DIAGNOSIS — F0280 Dementia in other diseases classified elsewhere without behavioral disturbance: Secondary | ICD-10-CM | POA: Diagnosis not present

## 2018-05-19 DIAGNOSIS — I2510 Atherosclerotic heart disease of native coronary artery without angina pectoris: Secondary | ICD-10-CM | POA: Diagnosis not present

## 2018-05-19 DIAGNOSIS — M1990 Unspecified osteoarthritis, unspecified site: Secondary | ICD-10-CM | POA: Diagnosis not present

## 2018-05-19 DIAGNOSIS — G309 Alzheimer's disease, unspecified: Secondary | ICD-10-CM | POA: Diagnosis not present

## 2018-05-19 DIAGNOSIS — I5043 Acute on chronic combined systolic (congestive) and diastolic (congestive) heart failure: Secondary | ICD-10-CM | POA: Diagnosis not present

## 2018-05-19 DIAGNOSIS — F419 Anxiety disorder, unspecified: Secondary | ICD-10-CM | POA: Diagnosis not present

## 2018-05-19 DIAGNOSIS — I130 Hypertensive heart and chronic kidney disease with heart failure and stage 1 through stage 4 chronic kidney disease, or unspecified chronic kidney disease: Secondary | ICD-10-CM | POA: Diagnosis not present

## 2018-05-19 DIAGNOSIS — M069 Rheumatoid arthritis, unspecified: Secondary | ICD-10-CM | POA: Diagnosis not present

## 2018-05-19 DIAGNOSIS — N184 Chronic kidney disease, stage 4 (severe): Secondary | ICD-10-CM | POA: Diagnosis not present

## 2018-05-19 DIAGNOSIS — F0280 Dementia in other diseases classified elsewhere without behavioral disturbance: Secondary | ICD-10-CM | POA: Diagnosis not present

## 2018-05-22 ENCOUNTER — Telehealth: Payer: Self-pay

## 2018-05-22 NOTE — Telephone Encounter (Signed)
Madeline Atkinson from [facility] left a vm msg. As per Madeline Atkinson, pt's daughter is going to pursue placement for memory care for pt. Social worker was requesting a verbal order to do a home visit to discuss options and facilities with family members. Returned a call back, no answer left a detailed vm msg giving verbal order to proceed with home visit with pt. Direct call back and fax info provided.

## 2018-05-23 ENCOUNTER — Telehealth: Payer: Self-pay

## 2018-05-23 DIAGNOSIS — R5383 Other fatigue: Secondary | ICD-10-CM

## 2018-05-23 DIAGNOSIS — I130 Hypertensive heart and chronic kidney disease with heart failure and stage 1 through stage 4 chronic kidney disease, or unspecified chronic kidney disease: Secondary | ICD-10-CM | POA: Diagnosis not present

## 2018-05-23 DIAGNOSIS — M1990 Unspecified osteoarthritis, unspecified site: Secondary | ICD-10-CM | POA: Diagnosis not present

## 2018-05-23 DIAGNOSIS — G309 Alzheimer's disease, unspecified: Secondary | ICD-10-CM

## 2018-05-23 DIAGNOSIS — M069 Rheumatoid arthritis, unspecified: Secondary | ICD-10-CM | POA: Diagnosis not present

## 2018-05-23 DIAGNOSIS — F0280 Dementia in other diseases classified elsewhere without behavioral disturbance: Secondary | ICD-10-CM

## 2018-05-23 DIAGNOSIS — F419 Anxiety disorder, unspecified: Secondary | ICD-10-CM | POA: Diagnosis not present

## 2018-05-23 DIAGNOSIS — E871 Hypo-osmolality and hyponatremia: Secondary | ICD-10-CM

## 2018-05-23 DIAGNOSIS — I5043 Acute on chronic combined systolic (congestive) and diastolic (congestive) heart failure: Secondary | ICD-10-CM | POA: Diagnosis not present

## 2018-05-23 DIAGNOSIS — I2510 Atherosclerotic heart disease of native coronary artery without angina pectoris: Secondary | ICD-10-CM | POA: Diagnosis not present

## 2018-05-23 DIAGNOSIS — N184 Chronic kidney disease, stage 4 (severe): Secondary | ICD-10-CM | POA: Diagnosis not present

## 2018-05-23 NOTE — Telephone Encounter (Signed)
Social worker Sharyn Lull called on behalf of pt's daughter Madeline Atkinson). Requesting a clinical order from provider to have an evaluation at home for facility placement. She is also requesting for [place] to fax clinical notes to [facility] at [phone]. Contact number for facility is [phone]. If we have any inquiries, we may contact [facility] [phone] or SW Sharyn Lull at [phone].

## 2018-05-23 NOTE — Telephone Encounter (Signed)
Verbal orders ok.  Clinical notes - to records request, or can print and fax most recent 3 progress notes and lab results from past year

## 2018-05-24 ENCOUNTER — Other Ambulatory Visit: Payer: Self-pay | Admitting: Osteopathic Medicine

## 2018-05-24 DIAGNOSIS — I2510 Atherosclerotic heart disease of native coronary artery without angina pectoris: Secondary | ICD-10-CM

## 2018-05-24 DIAGNOSIS — Z8679 Personal history of other diseases of the circulatory system: Secondary | ICD-10-CM

## 2018-05-24 MED ORDER — ISOSORBIDE MONONITRATE ER 30 MG PO TB24: 30.0000 mg | ORAL_TABLET | Freq: Every day | ORAL | 0 refills | Status: AC

## 2018-05-24 MED ORDER — AMBULATORY NON FORMULARY MEDICATION: 0 refills | Status: AC

## 2018-05-24 MED ORDER — LISINOPRIL 2.5 MG PO TABS: 2.5000 mg | ORAL_TABLET | Freq: Every day | ORAL | 0 refills | Status: AC

## 2018-05-24 NOTE — Telephone Encounter (Signed)
Order placed, notes faxed to hospital attn Manus Gunning.  Sharyn Lull ([facility]) advised of status update

## 2018-05-25 DIAGNOSIS — R279 Unspecified lack of coordination: Secondary | ICD-10-CM | POA: Diagnosis not present

## 2018-05-25 DIAGNOSIS — R410 Disorientation, unspecified: Secondary | ICD-10-CM | POA: Diagnosis not present

## 2018-05-25 DIAGNOSIS — R0689 Other abnormalities of breathing: Secondary | ICD-10-CM | POA: Diagnosis not present

## 2018-05-25 DIAGNOSIS — Z743 Need for continuous supervision: Secondary | ICD-10-CM | POA: Diagnosis not present

## 2018-05-25 DIAGNOSIS — R531 Weakness: Secondary | ICD-10-CM | POA: Diagnosis not present

## 2018-06-05 ENCOUNTER — Ambulatory Visit: Payer: Medicare HMO

## 2018-06-06 ENCOUNTER — Telehealth: Payer: Self-pay

## 2018-06-06 NOTE — Telephone Encounter (Signed)
Pt's daughter left a vm msg stating that pt passed away earlier this morning. Ms. Madeline Atkinson thanks you for all that you have done for her mother while she was under your care. She stated you can give her a call if you have any inquiries.

## 2018-06-19 DEATH — deceased

## 2018-06-20 ENCOUNTER — Other Ambulatory Visit (HOSPITAL_COMMUNITY): Payer: Self-pay

## 2018-06-20 DIAGNOSIS — R1310 Dysphagia, unspecified: Secondary | ICD-10-CM

## 2018-06-22 ENCOUNTER — Ambulatory Visit (HOSPITAL_COMMUNITY): Admission: RE | Admit: 2018-06-22 | Payer: Medicare HMO | Source: Ambulatory Visit

## 2018-06-22 ENCOUNTER — Other Ambulatory Visit: Payer: Self-pay

## 2018-06-22 ENCOUNTER — Ambulatory Visit (HOSPITAL_COMMUNITY)
Admission: RE | Admit: 2018-06-22 | Discharge: 2018-06-22 | Disposition: A | Discharge status: Home / Self Care | Payer: Medicare HMO | Source: Ambulatory Visit | Attending: Osteopathic Medicine | Admitting: Osteopathic Medicine

## 2018-06-22 DIAGNOSIS — R1319 Other dysphagia: Secondary | ICD-10-CM

## 2018-11-08 ENCOUNTER — Ambulatory Visit: Payer: Medicare HMO | Admitting: Osteopathic Medicine

## 2020-10-10 IMAGING — CT CT HEAD W/O CM
3 series · 15 of 37 positions shown, 17 images · non-contrast
Comparison: None.

CLINICAL DATA: Altered mental status

EXAM:
CT HEAD WITHOUT CONTRAST
TECHNIQUE: Contiguous axial images were obtained from the base of the skull
through the vertex without intravenous contrast.

[Series 6: sag soft · sagittal · 0.33mm/px · 3 of 49 slices shown]
[im 17/49  brain]
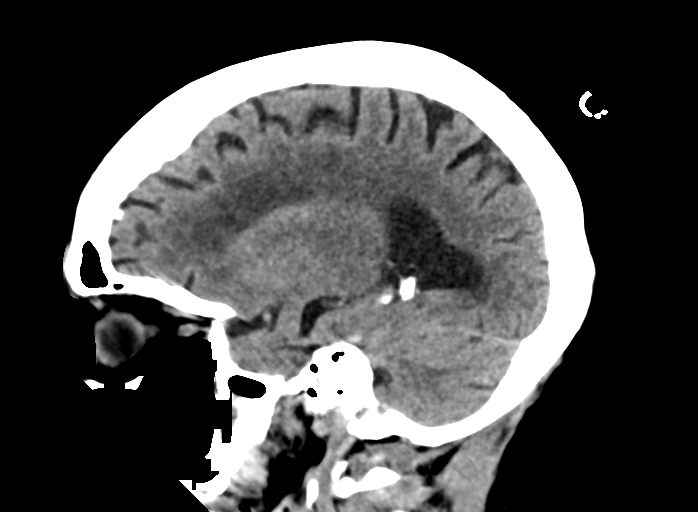
[im 25/49  brain]
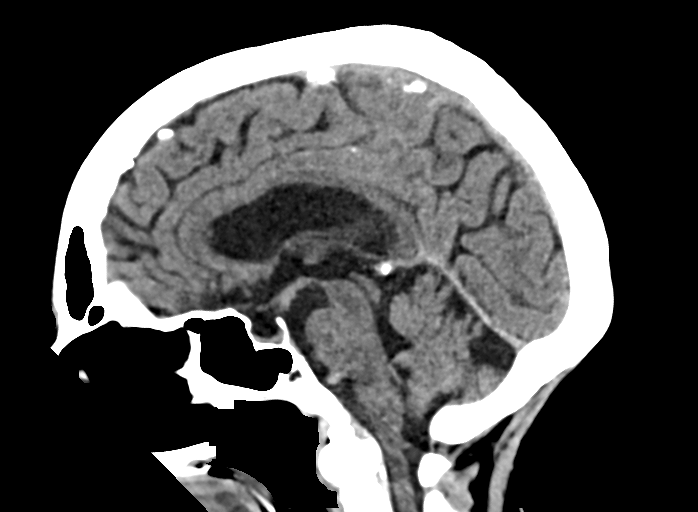
[im 33/49  brain]
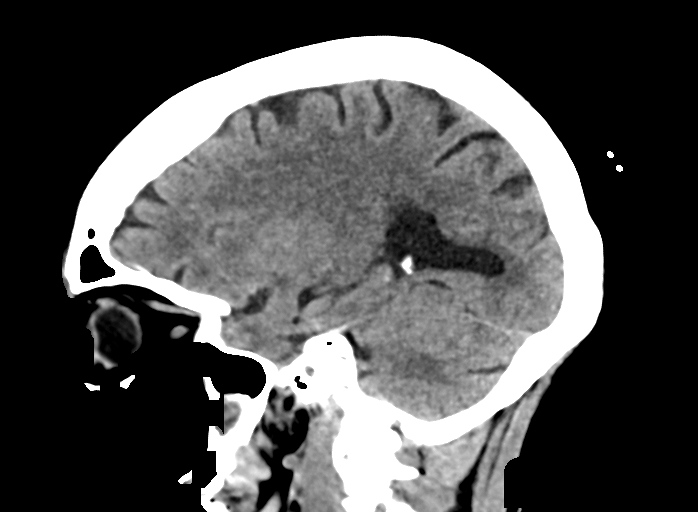

[Series 7: head wo · axial · 0.37mm/px · z∈[-164,-59]mm · 7 of 34 slices shown, 9 images]
[im 5/34  brain]
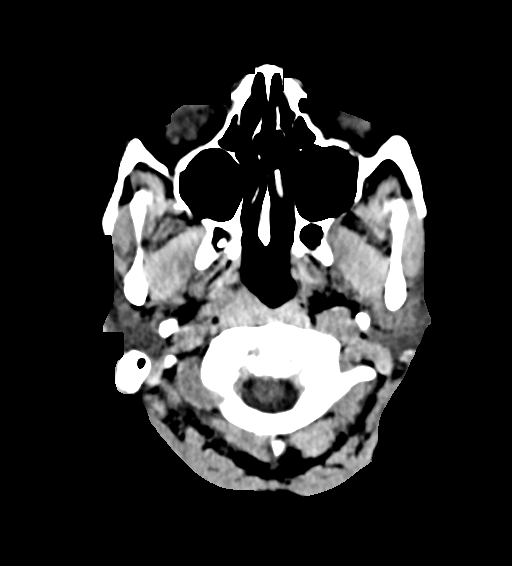
[im 5/34  bone]
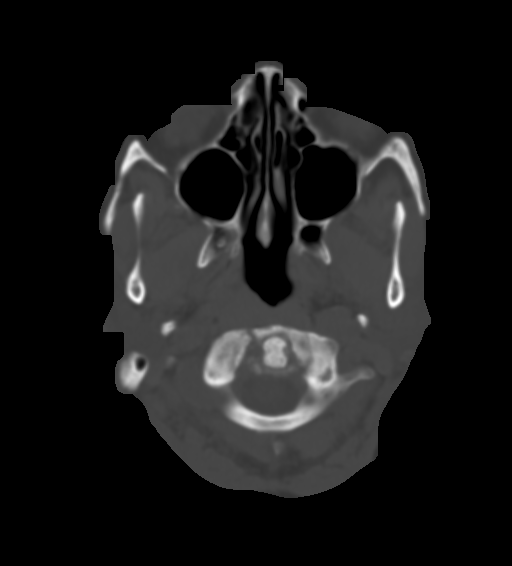
[im 9/34  brain]
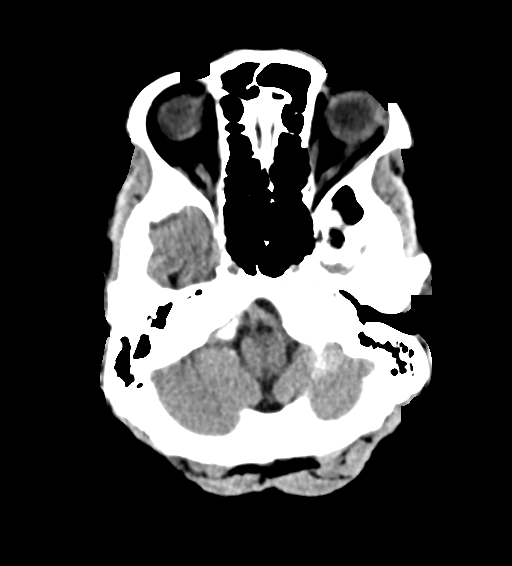
[im 13/34  brain]
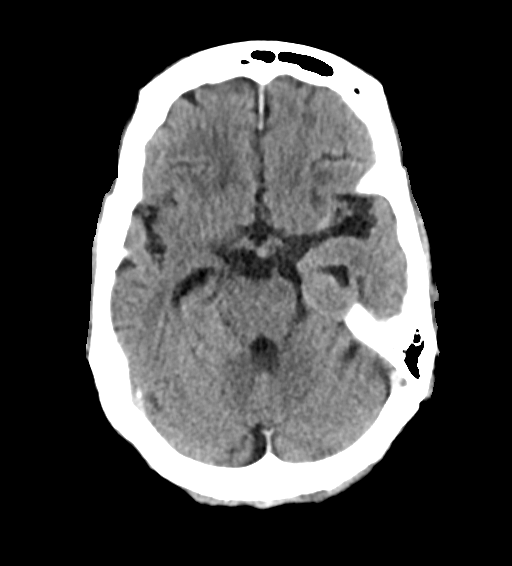
[im 17/34  brain]
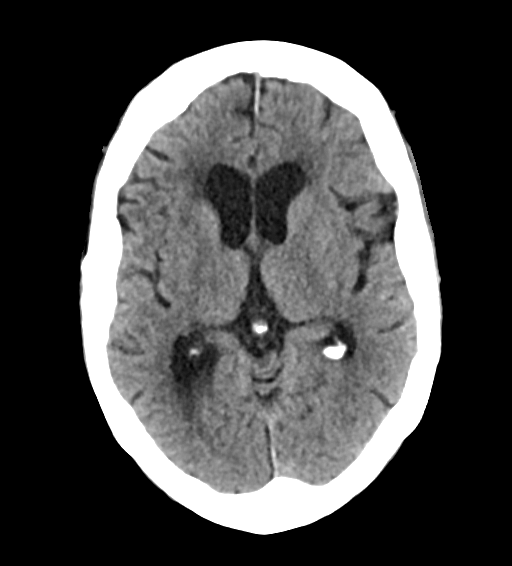
[im 21/34  brain]
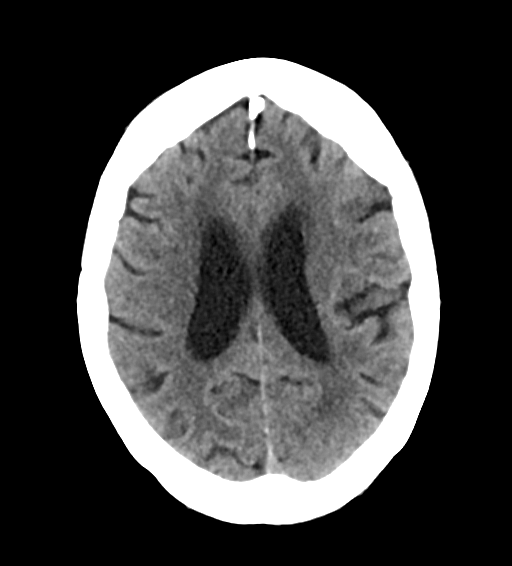
[im 21/34  bone]
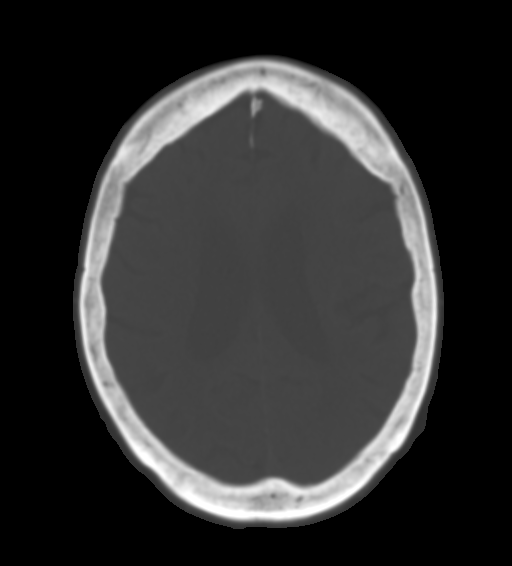
[im 25/34  brain]
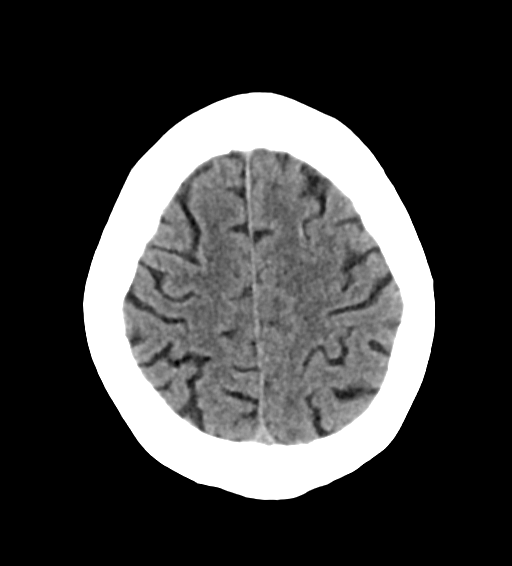
[im 29/34  brain]
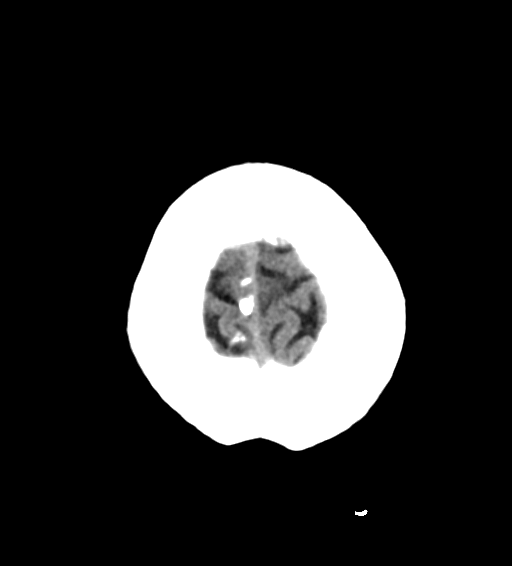

[Series 8: head bone · axial · 0.37mm/px · z∈[-156,-92]mm · 5 of 80 slices shown]
[im 8/80  bone]
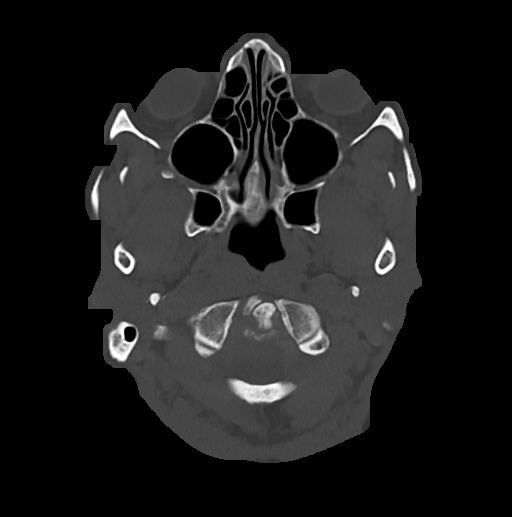
[im 16/80  bone]
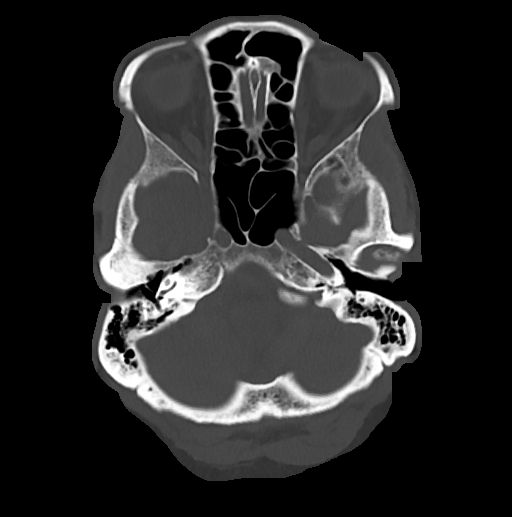
[im 24/80  bone]
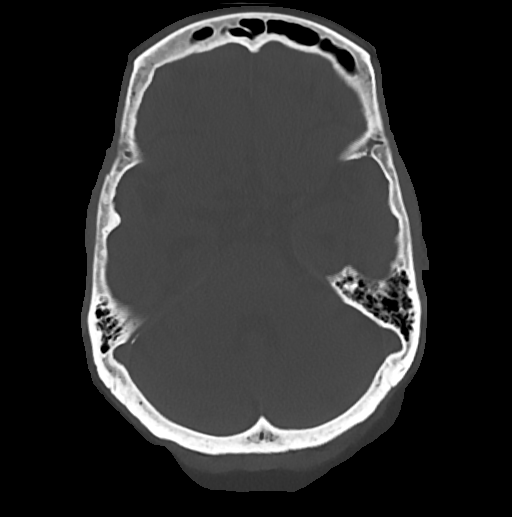
[im 36/80  bone]
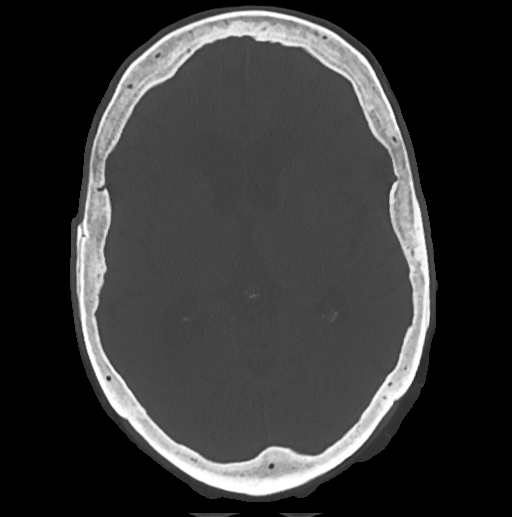
[im 44/80  bone]
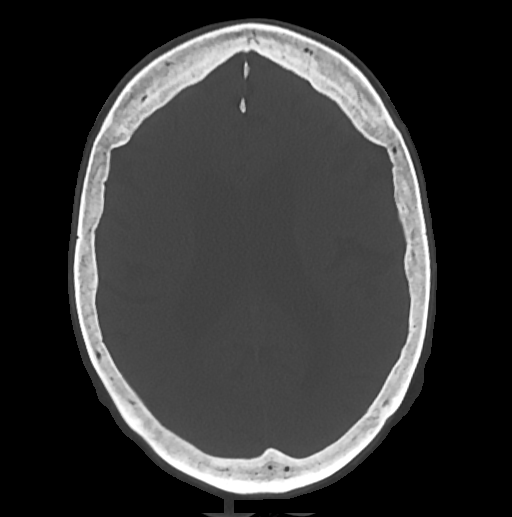

[15 of 37 positions shown; findings below may reference images not displayed]

FINDINGS: Brain: Age related involutional changes of the brain with
mild-to-moderate small vessel ischemic disease of periventricular
and subcortical white matter. No acute intracranial hemorrhage, mass
midline shift or edema. No extra-axial fluid collections. No
hydrocephalus. Midline fourth ventricle and basal cisterns. The
brainstem and cerebellum are nonacute.

Vascular: Atherosclerosis at the skull base. No hyperdense vessel
sign.

Skull: Intact without acute fracture or suspicious osseous lesions.

Sinuses/Orbits: Right cataract extraction. Intact orbits and globes.
Mild ethmoid sinus mucosal thickening. The frontal, sphenoid and
included maxillary sinuses are clear. The mastoids are clear.

Other: No significant calvarial soft tissue swelling.
IMPRESSION: 1. Involutional changes of the brain consistent with age.
2. Chronic appearing microvascular ischemic disease of
periventricular and subcortical white matter.
3. No acute appearing intracranial abnormality.
